# Patient Record
Sex: Female | Born: 1955 | Race: Black or African American | Hispanic: No | Smoking: Current every day smoker
Health system: Southern US, Community
[De-identification: ages and names within clinical notes are randomized; demographics above are authoritative.]

## PROBLEM LIST (undated history)

## (undated) DIAGNOSIS — I10 Essential (primary) hypertension: Secondary | ICD-10-CM

## (undated) DIAGNOSIS — F319 Bipolar disorder, unspecified: Secondary | ICD-10-CM

## (undated) DIAGNOSIS — E119 Type 2 diabetes mellitus without complications: Secondary | ICD-10-CM

## (undated) DIAGNOSIS — M1990 Unspecified osteoarthritis, unspecified site: Secondary | ICD-10-CM

## (undated) DIAGNOSIS — J449 Chronic obstructive pulmonary disease, unspecified: Secondary | ICD-10-CM

## (undated) DIAGNOSIS — G4730 Sleep apnea, unspecified: Secondary | ICD-10-CM

## (undated) DIAGNOSIS — Z8659 Personal history of other mental and behavioral disorders: Secondary | ICD-10-CM

## (undated) HISTORY — PX: JOINT REPLACEMENT: SHX530

---

## 2007-12-10 DIAGNOSIS — F319 Bipolar disorder, unspecified: Secondary | ICD-10-CM | POA: Insufficient documentation

## 2009-11-24 DIAGNOSIS — E1165 Type 2 diabetes mellitus with hyperglycemia: Secondary | ICD-10-CM | POA: Insufficient documentation

## 2012-12-06 ENCOUNTER — Emergency Department (HOSPITAL_COMMUNITY)
Admission: EM | Admit: 2012-12-06 | Discharge: 2012-12-06 | Disposition: A | Discharge status: Home / Self Care | Payer: Medicare Other | Attending: Emergency Medicine | Admitting: Emergency Medicine

## 2012-12-06 ENCOUNTER — Emergency Department (HOSPITAL_COMMUNITY): Payer: Medicare Other

## 2012-12-06 ENCOUNTER — Encounter (HOSPITAL_COMMUNITY): Payer: Self-pay

## 2012-12-06 DIAGNOSIS — J449 Chronic obstructive pulmonary disease, unspecified: Secondary | ICD-10-CM | POA: Insufficient documentation

## 2012-12-06 DIAGNOSIS — Z8709 Personal history of other diseases of the respiratory system: Secondary | ICD-10-CM | POA: Insufficient documentation

## 2012-12-06 DIAGNOSIS — E119 Type 2 diabetes mellitus without complications: Secondary | ICD-10-CM | POA: Insufficient documentation

## 2012-12-06 DIAGNOSIS — J40 Bronchitis, not specified as acute or chronic: Secondary | ICD-10-CM | POA: Insufficient documentation

## 2012-12-06 DIAGNOSIS — Z79899 Other long term (current) drug therapy: Secondary | ICD-10-CM | POA: Insufficient documentation

## 2012-12-06 DIAGNOSIS — Z720 Tobacco use: Secondary | ICD-10-CM

## 2012-12-06 DIAGNOSIS — G4730 Sleep apnea, unspecified: Secondary | ICD-10-CM | POA: Insufficient documentation

## 2012-12-06 DIAGNOSIS — I10 Essential (primary) hypertension: Secondary | ICD-10-CM | POA: Insufficient documentation

## 2012-12-06 DIAGNOSIS — Z8701 Personal history of pneumonia (recurrent): Secondary | ICD-10-CM | POA: Insufficient documentation

## 2012-12-06 DIAGNOSIS — J4489 Other specified chronic obstructive pulmonary disease: Secondary | ICD-10-CM | POA: Insufficient documentation

## 2012-12-06 DIAGNOSIS — F17200 Nicotine dependence, unspecified, uncomplicated: Secondary | ICD-10-CM | POA: Insufficient documentation

## 2012-12-06 HISTORY — DX: Bipolar disorder, unspecified: F31.9

## 2012-12-06 HISTORY — DX: Essential (primary) hypertension: I10

## 2012-12-06 HISTORY — DX: Type 2 diabetes mellitus without complications: E11.9

## 2012-12-06 HISTORY — DX: Sleep apnea, unspecified: G47.30

## 2012-12-06 HISTORY — DX: Chronic obstructive pulmonary disease, unspecified: J44.9

## 2012-12-06 HISTORY — DX: Unspecified osteoarthritis, unspecified site: M19.90

## 2012-12-06 LAB — BASIC METABOLIC PANEL
Calcium: 9.5 mg/dL (ref 8.4–10.5)
Creatinine, Ser: 0.72 mg/dL (ref 0.50–1.10)
GFR calc non Af Amer: >90 mL/min (ref >90)
Sodium: 138 mEq/L (ref 135–145)

## 2012-12-06 LAB — URINALYSIS, ROUTINE W REFLEX MICROSCOPIC
Ketones, ur: NEGATIVE mg/dL
Leukocytes, UA: NEGATIVE
Protein, ur: NEGATIVE mg/dL
Urobilinogen, UA: 0.2 mg/dL (ref 0.0–1.0)

## 2012-12-06 LAB — CBC WITH DIFFERENTIAL/PLATELET
Basophils Absolute: 0 10*3/uL (ref 0.0–0.1)
Eosinophils Absolute: 0.1 10*3/uL (ref 0.0–0.7)
Eosinophils Relative: 2 % (ref 0–5)
MCH: 28.9 pg (ref 26.0–34.0)
MCHC: 31.8 g/dL (ref 30.0–36.0)
MCV: 90.8 fL (ref 78.0–100.0)
Monocytes Absolute: 0.6 10*3/uL (ref 0.1–1.0)
Platelets: 193 10*3/uL (ref 150–400)
RDW: 13.3 % (ref 11.5–15.5)
WBC: 8.2 10*3/uL (ref 4.0–10.5)

## 2012-12-06 LAB — URINE MICROSCOPIC-ADD ON

## 2012-12-06 MED ORDER — PREDNISONE 20 MG PO TABS: 20.0000 mg | ORAL_TABLET | Freq: Two times a day (BID) | ORAL | Status: DC

## 2012-12-06 MED ORDER — ALBUTEROL (5 MG/ML) CONTINUOUS INHALATION SOLN
10.0000 mg/h | INHALATION_SOLUTION | Freq: Once | RESPIRATORY_TRACT | Status: AC
  Administered 2012-12-06: 10 mg/h via RESPIRATORY_TRACT
  Filled 2012-12-06: qty 20

## 2012-12-06 MED ORDER — ALBUTEROL SULFATE HFA 108 (90 BASE) MCG/ACT IN AERS
2.0000 | INHALATION_SPRAY | RESPIRATORY_TRACT | Status: DC | PRN
  Administered 2012-12-06: 2 via RESPIRATORY_TRACT
  Filled 2012-12-06: qty 6.7

## 2012-12-06 MED ORDER — AEROCHAMBER Z-STAT PLUS/MEDIUM MISC: 1.0000 | Freq: Once | Status: DC

## 2012-12-06 MED ORDER — PREDNISONE 50 MG PO TABS
60.0000 mg | ORAL_TABLET | Freq: Once | ORAL | Status: AC
  Administered 2012-12-06: 60 mg via ORAL
  Filled 2012-12-06: qty 1

## 2012-12-06 NOTE — ED Provider Notes (Addendum)
History     CSN: [id number]  Arrival date & time 12/06/12  7829   First MD Initiated Contact with Patient 12/06/12 0234      Chief Complaint  Patient presents with  . Shortness of Breath    (Consider location/radiation/quality/duration/timing/severity/associated sxs/prior treatment) HPI Comments: Donna Chase is a 57 y.o. female  Patient is a 57 y.o. female presenting with shortness of breath. The history is provided by the patient.  Shortness of Breath Severity:  Mild Onset quality:  Gradual Duration:  1 day Timing:  Constant Progression:  Unchanged Chronicity:  New Context: not known allergens and not occupational exposure   Context comment:  Usual activity Relieved by:  Nothing Worsened by:  Activity (and lying down) Ineffective treatments:  None tried Associated symptoms: cough (nonproductive)) and headaches   Associated symptoms: no diaphoresis, no fever and no sore throat     Past Medical History  Diagnosis Date  . Hypertension   . Diabetes mellitus without complication   . COPD (chronic obstructive pulmonary disease)   . Bipolar 1 disorder   . Arthritis   . Sleep apnea     History reviewed. No pertinent past surgical history.  No family history on file.  History  Substance Use Topics  . Smoking status: Current Every Day Smoker  . Smokeless tobacco: Not on file  . Alcohol Use: No    OB History   Grav Para Term Preterm Abortions TAB SAB Ect Mult Living                  Review of Systems  Constitutional: Negative for fever and diaphoresis.  HENT: Negative for sore throat.   Respiratory: Positive for cough (nonproductive)) and shortness of breath.   Neurological: Positive for headaches.  All other systems reviewed and are negative.    Allergies  Review of patient's allergies indicates no known allergies.  Home Medications   Current Outpatient Rx  Name  Route  Sig  Dispense  Refill  . citalopram (CELEXA) 20 MG tablet   Oral   Take  20 mg by mouth daily.         . divalproex (DEPAKOTE) 500 MG DR tablet   Oral   Take 500 mg by mouth 3 (three) times daily.         Marland Kitchen glyBURIDE (DIABETA) 2.5 MG tablet   Oral   Take 2.5 mg by mouth daily with breakfast.         . HYDROcodone-acetaminophen (NORCO/VICODIN) 5-325 MG per tablet   Oral   Take 1 tablet by mouth every 8 (eight) hours as needed for pain.         Marland Kitchen ibuprofen (ADVIL,MOTRIN) 800 MG tablet   Oral   Take 800 mg by mouth every 8 (eight) hours as needed for pain.         Marland Kitchen lisinopril (PRINIVIL,ZESTRIL) 10 MG tablet   Oral   Take 10 mg by mouth daily.         . meloxicam (MOBIC) 7.5 MG tablet   Oral   Take 7.5 mg by mouth daily.         . predniSONE (DELTASONE) 20 MG tablet   Oral   Take 1 tablet (20 mg total) by mouth 2 (two) times daily.   10 tablet   0     BP 110/59  Pulse 89  Temp(Src) 98.4 F (36.9 C) (Oral)  Resp 21  Ht 5\' 6"  (1.676 m)  Wt 255 lb (115.667 kg)  BMI 41.18 kg/m2  SpO2 92%  Physical Exam  Nursing note and vitals reviewed. Constitutional: She is oriented to person, place, and time. She appears well-developed.  Morbidly obese  HENT:  Head: Normocephalic and atraumatic.  Eyes: Conjunctivae and EOM are normal. Pupils are equal, round, and reactive to light.  Neck: Normal range of motion and phonation normal. Neck supple.  Cardiovascular: Normal rate, regular rhythm and intact distal pulses.   Pulmonary/Chest: Effort normal. No respiratory distress. She has wheezes. She has no rales. She exhibits no tenderness.  Decreased air movement, and scattered rhonchi  Abdominal: Soft. She exhibits no distension. There is no tenderness. There is no guarding.  Musculoskeletal: Normal range of motion.  Neurological: She is alert and oriented to person, place, and time. She has normal strength. She exhibits normal muscle tone.  Skin: Skin is warm and dry.  Psychiatric: She has a normal mood and affect. Her behavior is normal.  Judgment and thought content normal.    ED Course  Procedures (including critical care time)  Medications  albuterol (PROVENTIL HFA;VENTOLIN HFA) 108 (90 BASE) MCG/ACT inhaler 2 puff (2 puffs Inhalation Given 12/06/12 0603)  aerochamber Z-Stat Plus/medium 1 each (not administered)  albuterol (PROVENTIL,VENTOLIN) solution continuous neb (10 mg/hr Nebulization Given 12/06/12 0312)  predniSONE (DELTASONE) tablet 60 mg (60 mg Oral Given 12/06/12 0303)   Patient Vitals for the past 24 hrs:  BP Temp Temp src Pulse Resp SpO2 Height Weight  12/06/12 0606 - - - - - 92 % - -  12/06/12 0500 110/59 mmHg - - 89 - 94 % - -  12/06/12 0445 - - - 97 - 95 % - -  12/06/12 0430 - - - 94 - 100 % - -  12/06/12 0415 - - - 91 - 100 % - -  12/06/12 0330 - - - 88 21 100 % - -  12/06/12 0318 - - - - - 98 % - -  12/06/12 0243 129/81 mmHg 98.4 F (36.9 C) Oral 88 - 98 % 5\' 6"  (1.676 m) 255 lb (115.667 kg)     05:45 reevaluation- the patient was sleeping and her O2 sat was 88%. I aroused her, she awoke easily and took a few deep breath, that her O2 saturation improved to 95%. She states that she has sleep apnea, has been fitted with a mask, but does not wear it because she cannot tolerate it. She continues to smoke one half pack of cigarettes each day.     Labs Reviewed  BASIC METABOLIC PANEL - Abnormal; Notable for the following:    Glucose, Bld 169 (*)    All other components within normal limits  URINALYSIS, ROUTINE W REFLEX MICROSCOPIC - Abnormal; Notable for the following:    Hgb urine dipstick TRACE (*)    All other components within normal limits  URINE MICROSCOPIC-ADD ON - Abnormal; Notable for the following:    Squamous Epithelial / LPF MANY (*)    Bacteria, UA FEW (*)    All other components within normal limits  CBC WITH DIFFERENTIAL   Dg Chest 2 View  12/06/2012  *RADIOLOGY REPORT*  Clinical Data: Shortness of breath.  CHEST - 2 VIEW  Comparison: None.  Findings: Shallow inspiration.  Mild  cardiac enlargement with normal pulmonary vascularity.  No focal consolidation or airspace disease in the lungs.  No blunting of costophrenic angles.  No pneumothorax.  Degenerative changes in the thoracic spine.  IMPRESSION: Shallow inspiration.  No evidence of active pulmonary disease.  Original Report Authenticated By: Burman Nieves, M.D.    Nursing Notes Reviewed/ Care Coordinated, and agree without changes. Applicable Imaging Reviewed. Radiologic imaging report reviewed and images by radiography  - viewed, by me. Interpretation of Laboratory Data incorporated into ED treatment  1. Bronchitis   2. Sleep apnea   3. Tobacco abuse       MDM  Bronchitis, etiology, not clear. The patient has incidental sleep apnea that is not currently being treated, at home. Pneumonia, PE, ACS. Doubt metabolic instability, serious bacterial infection or impending vascular collapse; the patient is stable for discharge.    Plan: Home Medications- albuterol inhaler with AeroChamber for home use, but does; Home Treatments- rest, stop smoking; Recommended follow up- follow up PCP, as soon as possible, for reevaluation, and to be reassessed for sleep apnea          Flint Melter, MD 12/06/12 1610  Flint Melter, MD 12/06/12 [phone]

## 2012-12-06 NOTE — Progress Notes (Signed)
Pt finished Cat, placed on 2lpm/[place]

## 2012-12-06 NOTE — Progress Notes (Addendum)
Treatment stopped for pt to go to x-ray and bathroom restarted as of note

## 2013-10-12 ENCOUNTER — Other Ambulatory Visit: Payer: Self-pay | Admitting: Neurosurgery

## 2013-11-02 ENCOUNTER — Encounter (HOSPITAL_COMMUNITY): Payer: Self-pay | Admitting: Pharmacy Technician

## 2013-11-08 ENCOUNTER — Inpatient Hospital Stay (HOSPITAL_COMMUNITY): Admission: RE | Admit: 2013-11-08 | Payer: Medicare Other | Source: Ambulatory Visit | Admitting: Neurosurgery

## 2013-11-08 ENCOUNTER — Encounter (HOSPITAL_COMMUNITY): Admission: RE | Payer: Self-pay | Source: Ambulatory Visit

## 2013-11-08 SURGERY — POSTERIOR LUMBAR FUSION 1 LEVEL
Anesthesia: General

## 2013-12-09 ENCOUNTER — Other Ambulatory Visit: Payer: Self-pay | Admitting: Neurosurgery

## 2014-01-04 ENCOUNTER — Encounter (HOSPITAL_COMMUNITY): Payer: Self-pay | Admitting: Pharmacy Technician

## 2014-01-10 ENCOUNTER — Encounter (HOSPITAL_COMMUNITY)
Admission: RE | Admit: 2014-01-10 | Discharge: 2014-01-10 | Disposition: A | Discharge status: Home / Self Care | Payer: Medicare Other | Source: Ambulatory Visit | Attending: Anesthesiology | Admitting: Anesthesiology

## 2014-01-10 ENCOUNTER — Encounter (HOSPITAL_COMMUNITY)
Admission: RE | Admit: 2014-01-10 | Discharge: 2014-01-10 | Disposition: A | Discharge status: Home / Self Care | Payer: Medicare Other | Source: Ambulatory Visit | Attending: Neurosurgery | Admitting: Neurosurgery

## 2014-01-10 ENCOUNTER — Encounter (HOSPITAL_COMMUNITY): Payer: Self-pay

## 2014-01-10 DIAGNOSIS — I10 Essential (primary) hypertension: Secondary | ICD-10-CM | POA: Insufficient documentation

## 2014-01-10 DIAGNOSIS — Z01812 Encounter for preprocedural laboratory examination: Secondary | ICD-10-CM | POA: Insufficient documentation

## 2014-01-10 DIAGNOSIS — Z01810 Encounter for preprocedural cardiovascular examination: Secondary | ICD-10-CM | POA: Insufficient documentation

## 2014-01-10 DIAGNOSIS — Z01818 Encounter for other preprocedural examination: Secondary | ICD-10-CM | POA: Insufficient documentation

## 2014-01-10 HISTORY — DX: Personal history of other mental and behavioral disorders: Z86.59

## 2014-01-10 LAB — CBC
HEMATOCRIT: 38.4 % (ref 36.0–46.0)
Hemoglobin: 11.8 g/dL — ABNORMAL LOW (ref 12.0–15.0)
MCH: 28.7 pg (ref 26.0–34.0)
MCHC: 30.7 g/dL (ref 30.0–36.0)
MCV: 93.4 fL (ref 78.0–100.0)
Platelets: 205 10*3/uL (ref 150–400)
RBC: 4.11 MIL/uL (ref 3.87–5.11)
RDW: 13 % (ref 11.5–15.5)
WBC: 6.3 10*3/uL (ref 4.0–10.5)

## 2014-01-10 LAB — BASIC METABOLIC PANEL
BUN: 13 mg/dL (ref 6–23)
CHLORIDE: 103 meq/L (ref 96–112)
CO2: 28 mEq/L (ref 19–32)
CREATININE: 0.65 mg/dL (ref 0.50–1.10)
Calcium: 8.8 mg/dL (ref 8.4–10.5)
GFR calc Af Amer: >90 mL/min (ref >90)
GFR calc non Af Amer: >90 mL/min (ref >90)
Glucose, Bld: 166 mg/dL — ABNORMAL HIGH (ref 70–99)
Potassium: 3.8 mEq/L (ref 3.7–5.3)
SODIUM: 142 meq/L (ref 137–147)

## 2014-01-10 LAB — SURGICAL PCR SCREEN
MRSA, PCR: NEGATIVE
Staphylococcus aureus: NEGATIVE

## 2014-01-10 LAB — TYPE AND SCREEN
ABO/RH(D): O POS
Antibody Screen: NEGATIVE

## 2014-01-10 LAB — ABO/RH: ABO/RH(D): O POS

## 2014-01-10 NOTE — Pre-Procedure Instructions (Signed)
Hart Robinsonsriscilla Bucklin  01/10/2014   Your procedure is scheduled on:  Monday, May 18th  [facility]mitting at 0530 AM.  Call this number if you have problems the morning of surgery: [phone]   Remember:   Do not eat food or drink liquids after midnight.   Take these medicines the morning of surgery with A SIP OF WATER: celexa, depakote, prednisone, vicodin if needed  Do NOT take any diabetes medication on morning of surgery   Do not wear jewelry, make-up or nail polish.  Do not wear lotions, powders, or perfumes. You may wear deodorant.  Do not shave 48 hours prior to surgery. Men may shave face and neck.  Do not bring valuables to the hospital.  [facility] is not responsible  for any belongings or valuables.               Contacts, dentures or bridgework may not be worn into surgery.  Leave suitcase in the car. After surgery it may be brought to your room.  For patients admitted to the hospital, discharge time is determined by your  treatment team.           Please read over the following fact sheets that you were given: Pain Booklet, Coughing and Deep Breathing, Blood Transfusion Information, MRSA Information and Surgical Site Infection Prevention [place] - Preparing for Surgery  Before surgery, you can play an important role.  Because skin is not sterile, your skin needs to be as free of germs as possible.  You can reduce the number of germs on you skin by washing with CHG (chlorahexidine gluconate) soap before surgery.  CHG is an antiseptic cleaner which kills germs and bonds with the skin to continue killing germs even after washing.  Please DO NOT use if you have an allergy to CHG or antibacterial soaps.  If your skin becomes reddened/irritated stop using the CHG and inform your nurse when you arrive at Short Stay.  Do not shave (including legs and underarms) for at least 48 hours prior to the first CHG shower.  You may shave your face.  Please  follow these instructions carefully:   1.  Shower with CHG Soap the night before surgery and the morning of Surgery.  2.  If you choose to wash your hair, wash your hair first as usual with your normal shampoo.  3.  After you shampoo, rinse your hair and body thoroughly to remove the shampoo.  4.  Use CHG as you would any other liquid soap.  You can apply CHG directly to the skin and wash gently with scrungie or a clean washcloth.  5.  Apply the CHG Soap to your body ONLY FROM THE NECK DOWN.  Do not use on open wounds or open sores.  Avoid contact with your eyes, ears, mouth and genitals (private parts).  Wash genitals (private parts) with your normal soap.  6.  Wash thoroughly, paying special attention to the area where your surgery will be performed.  7.  Thoroughly rinse your body with warm water from the neck down.  8.  DO NOT shower/wash with your normal soap after using and rinsing off the CHG Soap.  9.  Pat yourself dry with a clean towel.            10.  Wear clean pajamas.            11.  Place clean sheets on your bed the night of your  first shower and do not sleep with pets.  Day of Surgery  Do not apply any lotions/deoderants the morning of surgery.  Please wear clean clothes to the hospital/surgery center.

## 2014-01-10 NOTE — Progress Notes (Signed)
Primary physician - Dr. Janalyn RouseKaren Chase, [place] [facility] No cardiac workup in past

## 2014-01-11 NOTE — Progress Notes (Signed)
Anesthesia chart review: Patient is a 58 year old female scheduled for L4-5 laminectomy with PLIF on 01/17/14 by Dr. Lovell SheehanJenkins.  History includes HTN, smoking, COPD, Bipolar 1 disorder, OSA, arthritis, DM2, panic attacks, left THA. [place]. [place] No CV symptoms documented at her PAT visit.  EKG on 01/10/14 showed NSR.  Preoperative CXR and labs noted.    Further evaluation on the day of surgery by her assigned anesthesiologist, but if no acute changes then I would anticipate that she could proceed as planned.  Velna Ochsllison Maevyn Riordan, PA-C [facility]/Anesthesiology Phone [phone] 01/11/2014 10:16 AM

## 2014-01-16 MED ORDER — CEFAZOLIN SODIUM-DEXTROSE 2-3 GM-% IV SOLR
2.0000 g | INTRAVENOUS | Status: DC
  Filled 2014-01-16: qty 50

## 2014-01-17 ENCOUNTER — Encounter (HOSPITAL_COMMUNITY)
Admission: RE | Disposition: A | Discharge status: Skilled Nursing Facility | Payer: Medicare Other | Source: Ambulatory Visit | Attending: Neurosurgery

## 2014-01-17 ENCOUNTER — Encounter (HOSPITAL_COMMUNITY): Payer: Medicare Other | Admitting: Vascular Surgery

## 2014-01-17 ENCOUNTER — Inpatient Hospital Stay (HOSPITAL_COMMUNITY): Payer: Medicare Other

## 2014-01-17 ENCOUNTER — Inpatient Hospital Stay (HOSPITAL_COMMUNITY): Payer: Medicare Other | Admitting: Certified Registered Nurse Anesthetist

## 2014-01-17 ENCOUNTER — Inpatient Hospital Stay (HOSPITAL_COMMUNITY)
Admission: RE | Admit: 2014-01-17 | Discharge: 2014-01-21 | DRG: 460 | Disposition: A | Discharge status: Skilled Nursing Facility | Payer: Medicare Other | Source: Ambulatory Visit | Attending: Neurosurgery | Admitting: Neurosurgery

## 2014-01-17 ENCOUNTER — Encounter (HOSPITAL_COMMUNITY): Payer: Self-pay | Admitting: *Deleted

## 2014-01-17 DIAGNOSIS — G4730 Sleep apnea, unspecified: Secondary | ICD-10-CM | POA: Diagnosis present

## 2014-01-17 DIAGNOSIS — F17200 Nicotine dependence, unspecified, uncomplicated: Secondary | ICD-10-CM | POA: Diagnosis present

## 2014-01-17 DIAGNOSIS — M47817 Spondylosis without myelopathy or radiculopathy, lumbosacral region: Secondary | ICD-10-CM | POA: Diagnosis present

## 2014-01-17 DIAGNOSIS — F319 Bipolar disorder, unspecified: Secondary | ICD-10-CM | POA: Diagnosis present

## 2014-01-17 DIAGNOSIS — E119 Type 2 diabetes mellitus without complications: Secondary | ICD-10-CM | POA: Diagnosis present

## 2014-01-17 DIAGNOSIS — J4489 Other specified chronic obstructive pulmonary disease: Secondary | ICD-10-CM | POA: Diagnosis present

## 2014-01-17 DIAGNOSIS — I10 Essential (primary) hypertension: Secondary | ICD-10-CM | POA: Diagnosis present

## 2014-01-17 DIAGNOSIS — J449 Chronic obstructive pulmonary disease, unspecified: Secondary | ICD-10-CM | POA: Diagnosis present

## 2014-01-17 DIAGNOSIS — M810 Age-related osteoporosis without current pathological fracture: Secondary | ICD-10-CM | POA: Diagnosis present

## 2014-01-17 DIAGNOSIS — M51379 Other intervertebral disc degeneration, lumbosacral region without mention of lumbar back pain or lower extremity pain: Principal | ICD-10-CM | POA: Diagnosis present

## 2014-01-17 DIAGNOSIS — IMO0002 Reserved for concepts with insufficient information to code with codable children: Secondary | ICD-10-CM

## 2014-01-17 DIAGNOSIS — M5137 Other intervertebral disc degeneration, lumbosacral region: Principal | ICD-10-CM | POA: Diagnosis present

## 2014-01-17 DIAGNOSIS — Z96649 Presence of unspecified artificial hip joint: Secondary | ICD-10-CM

## 2014-01-17 DIAGNOSIS — F410 Panic disorder [episodic paroxysmal anxiety] without agoraphobia: Secondary | ICD-10-CM | POA: Diagnosis present

## 2014-01-17 DIAGNOSIS — Q762 Congenital spondylolisthesis: Secondary | ICD-10-CM

## 2014-01-17 DIAGNOSIS — M129 Arthropathy, unspecified: Secondary | ICD-10-CM | POA: Diagnosis present

## 2014-01-17 DIAGNOSIS — M4316 Spondylolisthesis, lumbar region: Secondary | ICD-10-CM

## 2014-01-17 LAB — GLUCOSE, CAPILLARY
GLUCOSE-CAPILLARY: 183 mg/dL — AB (ref 70–99)
Glucose-Capillary: 147 mg/dL — ABNORMAL HIGH (ref 70–99)
Glucose-Capillary: 205 mg/dL — ABNORMAL HIGH (ref 70–99)
Glucose-Capillary: 157 mg/dL — ABNORMAL HIGH (ref 70–99)
Glucose-Capillary: 180 mg/dL — ABNORMAL HIGH (ref 70–99)

## 2014-01-17 SURGERY — POSTERIOR LUMBAR FUSION 1 LEVEL
Anesthesia: General | Site: Back

## 2014-01-17 MED ORDER — ONDANSETRON HCL 4 MG/2ML IJ SOLN
INTRAMUSCULAR | Status: AC
  Filled 2014-01-17: qty 2

## 2014-01-17 MED ORDER — NEOSTIGMINE METHYLSULFATE 10 MG/10ML IV SOLN
INTRAVENOUS | Status: AC
  Filled 2014-01-17: qty 1

## 2014-01-17 MED ORDER — ROCURONIUM BROMIDE 50 MG/5ML IV SOLN
INTRAVENOUS | Status: AC
  Filled 2014-01-17: qty 1

## 2014-01-17 MED ORDER — PHENOL 1.4 % MT LIQD: 1.0000 | OROMUCOSAL | Status: DC | PRN

## 2014-01-17 MED ORDER — ARTIFICIAL TEARS OP OINT
TOPICAL_OINTMENT | OPHTHALMIC | Status: AC
  Filled 2014-01-17: qty 3.5

## 2014-01-17 MED ORDER — BACITRACIN ZINC 500 UNIT/GM EX OINT
TOPICAL_OINTMENT | CUTANEOUS | Status: DC | PRN
  Administered 2014-01-17: 1 via TOPICAL

## 2014-01-17 MED ORDER — DIAZEPAM 5 MG PO TABS
5.0000 mg | ORAL_TABLET | Freq: Four times a day (QID) | ORAL | Status: DC | PRN
  Administered 2014-01-19 – 2014-01-20 (×5): 5 mg via ORAL
  Filled 2014-01-17 (×5): qty 1

## 2014-01-17 MED ORDER — DIVALPROEX SODIUM 500 MG PO DR TAB
500.0000 mg | DELAYED_RELEASE_TABLET | Freq: Three times a day (TID) | ORAL | Status: DC
  Administered 2014-01-17 – 2014-01-21 (×12): 500 mg via ORAL
  Filled 2014-01-17 (×15): qty 1

## 2014-01-17 MED ORDER — MIDAZOLAM HCL 2 MG/2ML IJ SOLN
INTRAMUSCULAR | Status: AC
  Filled 2014-01-17: qty 2

## 2014-01-17 MED ORDER — HYDROCODONE-ACETAMINOPHEN 10-325 MG PO TABS: 1.0000 | ORAL_TABLET | ORAL | Status: DC | PRN

## 2014-01-17 MED ORDER — LIDOCAINE HCL (CARDIAC) 20 MG/ML IV SOLN
INTRAVENOUS | Status: AC
  Filled 2014-01-17: qty 5

## 2014-01-17 MED ORDER — GLYBURIDE 2.5 MG PO TABS
2.5000 mg | ORAL_TABLET | Freq: Every day | ORAL | Status: DC
  Administered 2014-01-18 – 2014-01-21 (×4): 2.5 mg via ORAL
  Filled 2014-01-17 (×5): qty 1

## 2014-01-17 MED ORDER — HYDROCORTISONE NA SUCCINATE PF 100 MG IJ SOLR
INTRAMUSCULAR | Status: DC | PRN
  Administered 2014-01-17: 100 mg via INTRAVENOUS

## 2014-01-17 MED ORDER — MENTHOL 3 MG MT LOZG: 1.0000 | LOZENGE | OROMUCOSAL | Status: DC | PRN

## 2014-01-17 MED ORDER — 0.9 % SODIUM CHLORIDE (POUR BTL) OPTIME
TOPICAL | Status: DC | PRN
  Administered 2014-01-17: 1000 mL

## 2014-01-17 MED ORDER — ALBUTEROL SULFATE HFA 108 (90 BASE) MCG/ACT IN AERS
INHALATION_SPRAY | RESPIRATORY_TRACT | Status: DC | PRN
  Administered 2014-01-17: 2 via RESPIRATORY_TRACT

## 2014-01-17 MED ORDER — PREDNISONE 20 MG PO TABS
20.0000 mg | ORAL_TABLET | Freq: Two times a day (BID) | ORAL | Status: DC
  Filled 2014-01-17 (×2): qty 1

## 2014-01-17 MED ORDER — ONDANSETRON HCL 4 MG/2ML IJ SOLN
INTRAMUSCULAR | Status: DC | PRN
  Administered 2014-01-17: 4 mg via INTRAVENOUS

## 2014-01-17 MED ORDER — OXYCODONE HCL 5 MG PO TABS
ORAL_TABLET | ORAL | Status: AC
  Filled 2014-01-17: qty 2

## 2014-01-17 MED ORDER — SUCCINYLCHOLINE CHLORIDE 20 MG/ML IJ SOLN
INTRAMUSCULAR | Status: AC
  Filled 2014-01-17: qty 1

## 2014-01-17 MED ORDER — ROCURONIUM BROMIDE 100 MG/10ML IV SOLN
INTRAVENOUS | Status: DC | PRN
  Administered 2014-01-17: 50 mg via INTRAVENOUS

## 2014-01-17 MED ORDER — PROPOFOL 10 MG/ML IV BOLUS
INTRAVENOUS | Status: AC
  Filled 2014-01-17: qty 20

## 2014-01-17 MED ORDER — OXYCODONE HCL 5 MG PO TABS
10.0000 mg | ORAL_TABLET | ORAL | Status: DC | PRN
  Administered 2014-01-17 – 2014-01-21 (×7): 10 mg via ORAL
  Filled 2014-01-17 (×7): qty 2

## 2014-01-17 MED ORDER — METHYLPREDNISOLONE SODIUM SUCC 125 MG IJ SOLR
60.0000 mg | Freq: Four times a day (QID) | INTRAMUSCULAR | Status: AC
  Administered 2014-01-17 – 2014-01-18 (×6): 60 mg via INTRAVENOUS
  Filled 2014-01-17: qty 2
  Filled 2014-01-17: qty 0.96
  Filled 2014-01-17: qty 2
  Filled 2014-01-17: qty 0.96
  Filled 2014-01-17: qty 2
  Filled 2014-01-17: qty 0.96

## 2014-01-17 MED ORDER — ONDANSETRON HCL 4 MG/2ML IJ SOLN: 4.0000 mg | INTRAMUSCULAR | Status: DC | PRN

## 2014-01-17 MED ORDER — HYDROMORPHONE HCL PF 1 MG/ML IJ SOLN
INTRAMUSCULAR | Status: AC
  Filled 2014-01-17: qty 1

## 2014-01-17 MED ORDER — ACETAMINOPHEN 650 MG RE SUPP: 650.0000 mg | RECTAL | Status: DC | PRN

## 2014-01-17 MED ORDER — GLYCOPYRROLATE 0.2 MG/ML IJ SOLN
INTRAMUSCULAR | Status: DC | PRN
  Administered 2014-01-17: .6 mg via INTRAVENOUS

## 2014-01-17 MED ORDER — BUPIVACAINE-EPINEPHRINE (PF) 0.5% -1:200000 IJ SOLN
INTRAMUSCULAR | Status: DC | PRN
  Administered 2014-01-17: 10 mL

## 2014-01-17 MED ORDER — BUPIVACAINE LIPOSOME 1.3 % IJ SUSP
INTRAMUSCULAR | Status: DC | PRN
  Administered 2014-01-17: 20 mL

## 2014-01-17 MED ORDER — FENTANYL CITRATE 0.05 MG/ML IJ SOLN
INTRAMUSCULAR | Status: DC | PRN
  Administered 2014-01-17: 50 ug via INTRAVENOUS
  Administered 2014-01-17: 150 ug via INTRAVENOUS

## 2014-01-17 MED ORDER — DOCUSATE SODIUM 100 MG PO CAPS
100.0000 mg | ORAL_CAPSULE | Freq: Two times a day (BID) | ORAL | Status: DC
  Administered 2014-01-17 – 2014-01-21 (×9): 100 mg via ORAL
  Filled 2014-01-17 (×8): qty 1

## 2014-01-17 MED ORDER — EPHEDRINE SULFATE 50 MG/ML IJ SOLN
INTRAMUSCULAR | Status: AC
  Filled 2014-01-17: qty 1

## 2014-01-17 MED ORDER — EPHEDRINE SULFATE 50 MG/ML IJ SOLN
INTRAMUSCULAR | Status: DC | PRN
  Administered 2014-01-17 (×2): 5 mg via INTRAVENOUS

## 2014-01-17 MED ORDER — BUPIVACAINE LIPOSOME 1.3 % IJ SUSP
20.0000 mL | INTRAMUSCULAR | Status: AC
  Filled 2014-01-17: qty 20

## 2014-01-17 MED ORDER — MORPHINE SULFATE 2 MG/ML IJ SOLN: 1.0000 mg | INTRAMUSCULAR | Status: DC | PRN

## 2014-01-17 MED ORDER — PHENYLEPHRINE HCL 10 MG/ML IJ SOLN
10.0000 mg | INTRAVENOUS | Status: DC | PRN
  Administered 2014-01-17: 10 ug/min via INTRAVENOUS

## 2014-01-17 MED ORDER — PHENYLEPHRINE HCL 10 MG/ML IJ SOLN
INTRAMUSCULAR | Status: DC | PRN
  Administered 2014-01-17: 80 ug via INTRAVENOUS
  Administered 2014-01-17 (×2): 120 ug via INTRAVENOUS
  Administered 2014-01-17: 80 ug via INTRAVENOUS

## 2014-01-17 MED ORDER — SODIUM CHLORIDE 0.9 % IJ SOLN
INTRAMUSCULAR | Status: AC
  Filled 2014-01-17: qty 10

## 2014-01-17 MED ORDER — PROPOFOL 10 MG/ML IV BOLUS
INTRAVENOUS | Status: DC | PRN
  Administered 2014-01-17: 200 mg via INTRAVENOUS

## 2014-01-17 MED ORDER — LACTATED RINGERS IV SOLN
INTRAVENOUS | Status: DC
  Administered 2014-01-17: 15:00:00 via INTRAVENOUS
  Administered 2014-01-17: 1000 mL via INTRAVENOUS

## 2014-01-17 MED ORDER — ALUM & MAG HYDROXIDE-SIMETH 200-200-20 MG/5ML PO SUSP: 30.0000 mL | Freq: Four times a day (QID) | ORAL | Status: DC | PRN

## 2014-01-17 MED ORDER — HYDROCODONE-ACETAMINOPHEN 5-325 MG PO TABS
1.0000 | ORAL_TABLET | ORAL | Status: DC | PRN
  Administered 2014-01-17: 2 via ORAL
  Administered 2014-01-21: 1 via ORAL
  Filled 2014-01-17: qty 2
  Filled 2014-01-17: qty 1

## 2014-01-17 MED ORDER — ACETAMINOPHEN 325 MG PO TABS
650.0000 mg | ORAL_TABLET | ORAL | Status: DC | PRN
  Administered 2014-01-18: 650 mg via ORAL
  Filled 2014-01-17: qty 2

## 2014-01-17 MED ORDER — NEOSTIGMINE METHYLSULFATE 10 MG/10ML IV SOLN
INTRAVENOUS | Status: DC | PRN
  Administered 2014-01-17: 4 mg via INTRAVENOUS

## 2014-01-17 MED ORDER — GLYCOPYRROLATE 0.2 MG/ML IJ SOLN
INTRAMUSCULAR | Status: AC
  Filled 2014-01-17: qty 3

## 2014-01-17 MED ORDER — ARTIFICIAL TEARS OP OINT
TOPICAL_OINTMENT | OPHTHALMIC | Status: DC | PRN
  Administered 2014-01-17: 1 via OPHTHALMIC

## 2014-01-17 MED ORDER — FENTANYL CITRATE 0.05 MG/ML IJ SOLN
INTRAMUSCULAR | Status: AC
  Filled 2014-01-17: qty 5

## 2014-01-17 MED ORDER — SODIUM CHLORIDE 0.9 % IR SOLN
Status: DC | PRN
  Administered 2014-01-17: 09:00:00

## 2014-01-17 MED ORDER — HYDROMORPHONE HCL PF 1 MG/ML IJ SOLN
0.2500 mg | INTRAMUSCULAR | Status: DC | PRN
  Administered 2014-01-17: 0.25 mg via INTRAVENOUS
  Administered 2014-01-17: 0.5 mg via INTRAVENOUS
  Administered 2014-01-17: 0.25 mg via INTRAVENOUS

## 2014-01-17 MED ORDER — THROMBIN 20000 UNITS EX SOLR
CUTANEOUS | Status: DC | PRN
  Administered 2014-01-17: 10:00:00 via TOPICAL

## 2014-01-17 MED ORDER — INSULIN ASPART 100 UNIT/ML ~~LOC~~ SOLN
0.0000 [IU] | SUBCUTANEOUS | Status: DC
  Administered 2014-01-17 – 2014-01-18 (×5): 4 [IU] via SUBCUTANEOUS
  Administered 2014-01-18: 7 [IU] via SUBCUTANEOUS
  Administered 2014-01-18 – 2014-01-19 (×3): 4 [IU] via SUBCUTANEOUS
  Administered 2014-01-19 (×2): 3 [IU] via SUBCUTANEOUS
  Administered 2014-01-20: 7 [IU] via SUBCUTANEOUS
  Administered 2014-01-20: 4 [IU] via SUBCUTANEOUS
  Administered 2014-01-20: 3 [IU] via SUBCUTANEOUS
  Administered 2014-01-21: 7 [IU] via SUBCUTANEOUS
  Administered 2014-01-21: 3 [IU] via SUBCUTANEOUS

## 2014-01-17 MED ORDER — LISINOPRIL 40 MG PO TABS
40.0000 mg | ORAL_TABLET | Freq: Every day | ORAL | Status: DC
  Administered 2014-01-17 – 2014-01-21 (×4): 40 mg via ORAL
  Filled 2014-01-17 (×6): qty 1

## 2014-01-17 MED ORDER — PHENYLEPHRINE 40 MCG/ML (10ML) SYRINGE FOR IV PUSH (FOR BLOOD PRESSURE SUPPORT)
PREFILLED_SYRINGE | INTRAVENOUS | Status: AC
  Filled 2014-01-17: qty 10

## 2014-01-17 MED ORDER — LACTATED RINGERS IV SOLN
INTRAVENOUS | Status: DC | PRN
  Administered 2014-01-17 (×3): via INTRAVENOUS

## 2014-01-17 MED ORDER — OXYCODONE-ACETAMINOPHEN 5-325 MG PO TABS
1.0000 | ORAL_TABLET | ORAL | Status: DC | PRN
  Administered 2014-01-18 – 2014-01-21 (×10): 2 via ORAL
  Filled 2014-01-17 (×12): qty 2

## 2014-01-17 MED ORDER — CEFAZOLIN SODIUM-DEXTROSE 2-3 GM-% IV SOLR
2.0000 g | Freq: Three times a day (TID) | INTRAVENOUS | Status: AC
  Administered 2014-01-17 (×2): 2 g via INTRAVENOUS
  Filled 2014-01-17 (×3): qty 50

## 2014-01-17 MED ORDER — CITALOPRAM HYDROBROMIDE 40 MG PO TABS
40.0000 mg | ORAL_TABLET | Freq: Every day | ORAL | Status: DC
  Administered 2014-01-17 – 2014-01-21 (×5): 40 mg via ORAL
  Filled 2014-01-17 (×7): qty 1

## 2014-01-17 MED ORDER — MIDAZOLAM HCL 5 MG/5ML IJ SOLN
INTRAMUSCULAR | Status: DC | PRN
  Administered 2014-01-17: 2 mg via INTRAVENOUS

## 2014-01-17 MED ORDER — PREDNISONE 20 MG PO TABS
20.0000 mg | ORAL_TABLET | Freq: Two times a day (BID) | ORAL | Status: DC
  Administered 2014-01-19 – 2014-01-21 (×5): 20 mg via ORAL
  Filled 2014-01-17 (×7): qty 1

## 2014-01-17 MED ORDER — LIDOCAINE HCL (CARDIAC) 20 MG/ML IV SOLN
INTRAVENOUS | Status: DC | PRN
  Administered 2014-01-17: 60 mg via INTRAVENOUS
  Administered 2014-01-17: 20 mg via INTRAVENOUS

## 2014-01-17 SURGICAL SUPPLY — 74 items
BAG DECANTER FOR FLEXI CONT (MISCELLANEOUS) ×3 IMPLANT
BENZOIN TINCTURE PRP APPL 2/3 (GAUZE/BANDAGES/DRESSINGS) ×3 IMPLANT
BLADE 10 SAFETY STRL DISP (BLADE) IMPLANT
BLADE SURG ROTATE 9660 (MISCELLANEOUS) IMPLANT
BRUSH SCRUB EZ PLAIN DRY (MISCELLANEOUS) ×3 IMPLANT
BUR ACORN 6.0 (BURR) ×2 IMPLANT
BUR ACORN 6.0MM (BURR) ×1
BUR MATCHSTICK NEURO 3.0 LAGG (BURR) ×3 IMPLANT
CANISTER SUCT 3000ML (MISCELLANEOUS) ×3 IMPLANT
CAP REVERE LOCKING (Cap) ×12 IMPLANT
CLOSURE WOUND 1/2 X4 (GAUZE/BANDAGES/DRESSINGS) ×1
CONT SPEC 4OZ CLIKSEAL STRL BL (MISCELLANEOUS) ×3 IMPLANT
COVER BACK TABLE 24X17X13 BIG (DRAPES) IMPLANT
COVER TABLE BACK 60X90 (DRAPES) ×3 IMPLANT
DRAPE C-ARM 42X72 X-RAY (DRAPES) ×6 IMPLANT
DRAPE LAPAROTOMY 100X72X124 (DRAPES) ×3 IMPLANT
DRAPE POUCH INSTRU U-SHP 10X18 (DRAPES) ×3 IMPLANT
DRAPE PROXIMA HALF (DRAPES) ×3 IMPLANT
DRAPE SURG 17X23 STRL (DRAPES) ×12 IMPLANT
ELECT BLADE 4.0 EZ CLEAN MEGAD (MISCELLANEOUS) ×3
ELECT REM PT RETURN 9FT ADLT (ELECTROSURGICAL) ×3
ELECTRODE BLDE 4.0 EZ CLN MEGD (MISCELLANEOUS) ×1 IMPLANT
ELECTRODE REM PT RTRN 9FT ADLT (ELECTROSURGICAL) ×1 IMPLANT
EVACUATOR 1/8 PVC DRAIN (DRAIN) ×3 IMPLANT
GAUZE SPONGE 4X4 16PLY XRAY LF (GAUZE/BANDAGES/DRESSINGS) ×6 IMPLANT
GLOVE BIO SURGEON STRL SZ8.5 (GLOVE) ×6 IMPLANT
GLOVE BIOGEL PI IND STRL 7.0 (GLOVE) ×1 IMPLANT
GLOVE BIOGEL PI IND STRL 7.5 (GLOVE) ×1 IMPLANT
GLOVE BIOGEL PI INDICATOR 7.0 (GLOVE) ×2
GLOVE BIOGEL PI INDICATOR 7.5 (GLOVE) ×2
GLOVE ECLIPSE 7.0 STRL STRAW (GLOVE) ×3 IMPLANT
GLOVE ECLIPSE 7.5 STRL STRAW (GLOVE) ×9 IMPLANT
GLOVE EXAM NITRILE LRG STRL (GLOVE) IMPLANT
GLOVE EXAM NITRILE MD LF STRL (GLOVE) IMPLANT
GLOVE EXAM NITRILE XL STR (GLOVE) IMPLANT
GLOVE EXAM NITRILE XS STR PU (GLOVE) IMPLANT
GLOVE SS BIOGEL STRL SZ 8 (GLOVE) ×2 IMPLANT
GLOVE SS N UNI LF 7.0 STRL (GLOVE) ×9 IMPLANT
GLOVE SUPERSENSE BIOGEL SZ 8 (GLOVE) ×4
GOWN STRL REUS W/ TWL LRG LVL3 (GOWN DISPOSABLE) IMPLANT
GOWN STRL REUS W/ TWL XL LVL3 (GOWN DISPOSABLE) ×5 IMPLANT
GOWN STRL REUS W/TWL 2XL LVL3 (GOWN DISPOSABLE) IMPLANT
GOWN STRL REUS W/TWL LRG LVL3 (GOWN DISPOSABLE)
GOWN STRL REUS W/TWL XL LVL3 (GOWN DISPOSABLE) ×10
KIT BASIN OR (CUSTOM PROCEDURE TRAY) ×3 IMPLANT
KIT ROOM TURNOVER OR (KITS) ×3 IMPLANT
NEEDLE HYPO 21X1.5 SAFETY (NEEDLE) IMPLANT
NEEDLE HYPO 22GX1.5 SAFETY (NEEDLE) ×3 IMPLANT
NS IRRIG 1000ML POUR BTL (IV SOLUTION) ×3 IMPLANT
PACK FOAM VITOSS 10CC (Orthopedic Implant) ×3 IMPLANT
PACK LAMINECTOMY NEURO (CUSTOM PROCEDURE TRAY) ×3 IMPLANT
PAD ARMBOARD 7.5X6 YLW CONV (MISCELLANEOUS) ×9 IMPLANT
PATTIES SURGICAL .5 X1 (DISPOSABLE) IMPLANT
PATTIES SURGICAL 1X1 (DISPOSABLE) ×3 IMPLANT
PUTTY 10ML ACTIFUSE ABX (Putty) ×3 IMPLANT
ROD REVERE 6.35 45MM (Rod) ×6 IMPLANT
SCREW 7.5X50MM (Screw) ×12 IMPLANT
SPACER SUSTAIN O 10X26 12MM (Spacer) ×6 IMPLANT
SPONGE GAUZE 4X4 12PLY (GAUZE/BANDAGES/DRESSINGS) ×3 IMPLANT
SPONGE LAP 4X18 X RAY DECT (DISPOSABLE) IMPLANT
SPONGE NEURO XRAY DETECT 1X3 (DISPOSABLE) IMPLANT
SPONGE SURGIFOAM ABS GEL 100 (HEMOSTASIS) ×3 IMPLANT
STRIP CLOSURE SKIN 1/2X4 (GAUZE/BANDAGES/DRESSINGS) ×2 IMPLANT
SUT VIC AB 1 CT1 18XBRD ANBCTR (SUTURE) ×2 IMPLANT
SUT VIC AB 1 CT1 8-18 (SUTURE) ×4
SUT VIC AB 2-0 CP2 18 (SUTURE) ×6 IMPLANT
SYR 20CC LL (SYRINGE) IMPLANT
SYR 20ML ECCENTRIC (SYRINGE) ×3 IMPLANT
TAPE CLOTH SURG 4X10 WHT LF (GAUZE/BANDAGES/DRESSINGS) ×3 IMPLANT
TAPE STRIPS DRAPE STRL (GAUZE/BANDAGES/DRESSINGS) ×3 IMPLANT
TOWEL OR 17X24 6PK STRL BLUE (TOWEL DISPOSABLE) ×3 IMPLANT
TOWEL OR 17X26 10 PK STRL BLUE (TOWEL DISPOSABLE) ×3 IMPLANT
TRAY FOLEY CATH 14FRSI W/METER (CATHETERS) ×3 IMPLANT
WATER STERILE IRR 1000ML POUR (IV SOLUTION) ×3 IMPLANT

## 2014-01-17 NOTE — Anesthesia Procedure Notes (Signed)
Procedure Name: Intubation Date/Time: 01/17/2014 7:38 AM Performed by: Angelica PouSMITH, Danya Spearman PIZZICARA Pre-anesthesia Checklist: Patient identified, Patient being monitored, Emergency Drugs available, Timeout performed and Suction available Patient Re-evaluated:Patient Re-evaluated prior to inductionOxygen Delivery Method: Circle system utilized Preoxygenation: Pre-oxygenation with 100% oxygen Intubation Type: IV induction Ventilation: Oral airway inserted - appropriate to patient size and Mask ventilation without difficulty Laryngoscope Size: Mac and 4 Grade View: Grade I Tube type: Oral Tube size: 7.0 mm Number of attempts: 1 Airway Equipment and Method: Oral airway and Stylet Placement Confirmation: ETT inserted through vocal cords under direct vision,  breath sounds checked- equal and bilateral and positive ETCO2 Secured at: 22 cm Tube secured with: Tape Dental Injury: Teeth and Oropharynx as per pre-operative assessment

## 2014-01-17 NOTE — Progress Notes (Signed)
Utilization review completed.  

## 2014-01-17 NOTE — Anesthesia Preprocedure Evaluation (Signed)
Anesthesia Evaluation  Patient identified by MRN, date of birth, ID band Patient awake    Reviewed: Allergy & Precautions, H&P , NPO status , Patient's Chart, lab work & pertinent test results  Airway Mallampati: II      Dental   Pulmonary sleep apnea , COPDCurrent Smoker,  breath sounds clear to auscultation        Cardiovascular hypertension, Rhythm:Regular Rate:Normal     Neuro/Psych    GI/Hepatic negative GI ROS,   Endo/Other  diabetes  Renal/GU      Musculoskeletal   Abdominal   Peds  Hematology   Anesthesia Other Findings   Reproductive/Obstetrics                           Anesthesia Physical Anesthesia Plan  ASA: III  Anesthesia Plan: General   Post-op Pain Management:    Induction: Intravenous  Airway Management Planned: Oral ETT  Additional Equipment:   Intra-op Plan:   Post-operative Plan: Possible Post-op intubation/ventilation  Informed Consent: I have reviewed the patients History and Physical, chart, labs and discussed the procedure including the risks, benefits and alternatives for the proposed anesthesia with the patient or authorized representative who has indicated his/her understanding and acceptance.   Dental advisory given  Plan Discussed with: CRNA and Anesthesiologist  Anesthesia Plan Comments:         Anesthesia Quick Evaluation

## 2014-01-17 NOTE — Progress Notes (Signed)
Patient ID: Donna Chase, female   DOB: July 22, 1956, 58 y.o.   MRN: [id number] Subjective:  I saw the patient in the PACU. She was, but easily arousable. She was in no apparent distress.  Objective: Vital signs in last 24 hours: Temp:  [98.8 F (37.1 C)-99.7 F (37.6 C)] 98.8 F (37.1 C) (05/18 1324) Pulse Rate:  [67-93] 67 (05/18 1324) Resp:  [14-20] 18 (05/18 1324) BP: (109-161)/(63-83) 124/63 mmHg (05/18 1324) SpO2:  [93 %-100 %] 100 % (05/18 1324) Weight:  [113.853 kg (251 lb)] 113.853 kg (251 lb) (05/18 0627)  Intake/Output from previous day:   Intake/Output this shift: Total I/O In: 2600 [I.V.:2500; Blood:100] Out: 465 [Urine:150; Drains:15; Blood:300]  Physical exam the patient was, but arousable. She was moving all 4 extremities.  Lab Results: No results found for this basename: WBC, HGB, HCT, PLT,  in the last 72 hours BMET No results found for this basename: NA, K, CL, CO2, GLUCOSE, BUN, CREATININE, CALCIUM,  in the last 72 hours  Studies/Results: Dg Lumbar Spine 2-3 Views  01/17/2014   CLINICAL DATA:  L4-5 PLIF  EXAM: DG C-ARM 61-120 MIN; LUMBAR SPINE - 2-3 VIEW  COMPARISON:  01/17/2014  FINDINGS: The patient has undergone posterior fusion with interbody fusion device at L4-5. Frontal and lateral views are acquired with posterior retractor in place.  IMPRESSION: L4-5 fusion.   Electronically Signed   By: Rosalie GumsBeth  Brown M.D.   On: 01/17/2014 12:27   Dg Lumbar Spine 1 View  01/17/2014   CLINICAL DATA:  L4-5 PLIF  EXAM: LUMBAR SPINE - 1 VIEW  COMPARISON:  None.  FINDINGS: Lateral portable radiograph is annotated in the lower lumbar spine levels number to. Tissue spreaders are posterior to the L3-4 disc space. There is a surgical probe which is posterior to the superior endplate of L4. Marland Kitchen.  IMPRESSION: 1. Portable intraoperative radiographs performed for probe localization.   Electronically Signed   By: Signa Kellaylor  Stroud M.D.   On: 01/17/2014 09:04   Dg C-arm 1-60  Min  01/17/2014   CLINICAL DATA:  L4-5 PLIF  EXAM: DG C-ARM 61-120 MIN; LUMBAR SPINE - 2-3 VIEW  COMPARISON:  01/17/2014  FINDINGS: The patient has undergone posterior fusion with interbody fusion device at L4-5. Frontal and lateral views are acquired with posterior retractor in place.  IMPRESSION: L4-5 fusion.   Electronically Signed   By: Rosalie GumsBeth  Brown M.D.   On: 01/17/2014 12:27    Assessment/Plan: The patient is doing well.  LOS: 0 days     Cristi LoronJeffrey D [name] 01/17/2014, 3:23 PM

## 2014-01-17 NOTE — H&P (Signed)
Subjective: The patient is a 58 year old black female who is complaining of back, buttock, and leg pain consistent with neurogenic claudication. She has failed medical management and was worked up with x-rays and a lumbar MRI. This demonstrated an L4-5 spondylolisthesis with spinal stenosis. I discussed the various treatment options with the patient including surgery. She has weighed the risks, benefits, and alternatives surgery decided proceed with a L4-L5 decompression, instrumentation, and fusion.   Past Medical History  Diagnosis Date  . Hypertension   . COPD (chronic obstructive pulmonary disease)   . Bipolar 1 disorder   . Arthritis   . Sleep apnea   . Diabetes mellitus without complication     fasting 80-90  . History of panic attacks     takes depakote for this    Past Surgical History  Procedure Laterality Date  . Joint replacement Left     hip     No Known Allergies  History  Substance Use Topics  . Smoking status: Current Every Day Smoker -- 0.50 packs/day for 30 years    Types: Cigarettes  . Smokeless tobacco: Not on file  . Alcohol Use: No    History reviewed. No pertinent family history. Prior to Admission medications   Medication Sig Start Date End Date Taking? Authorizing Provider  citalopram (CELEXA) 40 MG tablet Take 40 mg by mouth daily.   Yes Historical Provider, MD  diclofenac (VOLTAREN) 75 MG EC tablet Take 75 mg by mouth 2 (two) times daily.   Yes Historical Provider, MD  divalproex (DEPAKOTE) 500 MG DR tablet Take 500 mg by mouth 3 (three) times daily.   Yes Historical Provider, MD  glyBURIDE (DIABETA) 2.5 MG tablet Take 2.5 mg by mouth daily with breakfast.   Yes Historical Provider, MD  HYDROcodone-acetaminophen (NORCO) 10-325 MG per tablet Take 1 tablet by mouth every 6 (six) hours as needed for moderate pain.   Yes Historical Provider, MD  lisinopril (PRINIVIL,ZESTRIL) 40 MG tablet Take 40 mg by mouth daily.   Yes Historical Provider, MD   methocarbamol (ROBAXIN) 500 MG tablet Take 500 mg by mouth every 8 (eight) hours as needed for muscle spasms.   Yes Historical Provider, MD  oxycodone (OXY-IR) 5 MG capsule Take 10 mg by mouth every 6 (six) hours as needed.   Yes Historical Provider, MD  predniSONE (DELTASONE) 20 MG tablet Take 20 mg by mouth 2 (two) times daily. 12/06/12  Yes Flint MelterElliott L Wentz, MD     Review of Systems  Positive ROS: As above  All other systems have been reviewed and were otherwise negative with the exception of those mentioned in the HPI and as above.  Objective: Vital signs in last 24 hours: Temp:  [99.7 F (37.6 C)] 99.7 F (37.6 C) (05/18 0627) Pulse Rate:  [75] 75 (05/18 0627) Resp:  [20] 20 (05/18 0627) BP: (134)/(65) 134/65 mmHg (05/18 0627) SpO2:  [100 %] 100 % (05/18 0627) Weight:  [113.853 kg (251 lb)] 113.853 kg (251 lb) (05/18 0627)  General Appearance: Alert, cooperative, no distress, Head: Normocephalic, without obvious abnormality, atraumatic Eyes: PERRL, conjunctiva/corneas clear, EOM's intact,    Ears: Normal  Throat: Normal  Neck: Supple, symmetrical, trachea midline, no adenopathy; thyroid: No enlargement/tenderness/nodules; no carotid bruit or JVD Back: Symmetric, no curvature, ROM normal, no CVA tenderness Lungs: Clear to auscultation bilaterally, respirations unlabored Heart: Regular rate and rhythm, no murmur, rub or gallop Abdomen: Soft, non-tender,, no masses, no organomegaly Extremities: Extremities normal, atraumatic, no cyanosis or edema Pulses:  2+ and symmetric all extremities Skin: Skin color, texture, turgor normal, no rashes or lesions  NEUROLOGIC:   Mental status: alert and oriented, no aphasia, good attention span, Fund of knowledge/ memory ok Motor Exam - grossly normal Sensory Exam - grossly normal Reflexes:  Coordination - grossly normal Gait - grossly normal Balance - grossly normal Cranial Nerves: I: smell Not tested  II: visual acuity  OS: Normal   OD: Normal   II: visual fields Full to confrontation  II: pupils Equal, round, reactive to light  III,VII: ptosis None  III,IV,VI: extraocular muscles  Full ROM  V: mastication Normal  V: facial light touch sensation  Normal  V,VII: corneal reflex  Present  VII: facial muscle function - upper  Normal  VII: facial muscle function - lower Normal  VIII: hearing Not tested  IX: soft palate elevation  Normal  IX,X: gag reflex Present  XI: trapezius strength  5/5  XI: sternocleidomastoid strength 5/5  XI: neck flexion strength  5/5  XII: tongue strength  Normal    Data Review Lab Results  Component Value Date   WBC 6.3 01/10/2014   HGB 11.8* 01/10/2014   HCT 38.4 01/10/2014   MCV 93.4 01/10/2014   PLT 205 01/10/2014   Lab Results  Component Value Date   NA 142 01/10/2014   K 3.8 01/10/2014   CL 103 01/10/2014   CO2 28 01/10/2014   BUN 13 01/10/2014   CREATININE 0.65 01/10/2014   GLUCOSE 166* 01/10/2014   No results found for this basename: INR, PROTIME    Assessment/Plan: L4-5 spondylolisthesis, spinal stenosis, lumbago, lumbar radiculopathy, neurogenic claudication: I have discussed the situation with the patient. I have reviewed her imaging studies with her and pointed out the abnormalities. We have discussed the various treatment options including surgery. I have described the surgical treatment option of an L4-L5 decompression, instrumentation, and fusion. I have shown her surgical models. We have discussed the risks, benefits, alternatives, and likelihood of achieving our goals with surgery. I have answered all her questions. She has decided proceed with surgery.   Cristi LoronJeffrey D Gaberial Cada 01/17/2014 7:22 AM

## 2014-01-17 NOTE — Transfer of Care (Addendum)
Immediate Anesthesia Transfer of Care Note  Patient: Donna Chase  Procedure(s) Performed: Procedure(s) with comments: POSTERIOR LUMBAR FUSION 1 LEVEL (N/A) - L45 laminectomy with posterior lumbar interbody fusion with interbody prosthesis posterior lateral arthrodesis and posterior nonsegmental instrumentation  Patient Location: PACU  Anesthesia Type:General  Level of Consciousness: awake, patient cooperative and lethargic  Airway & Oxygen Therapy: Patient Spontanous Breathing and Patient connected to face mask oxygen  Post-op Assessment: Report given to PACU RN and Post -op Vital signs reviewed and stable, moves all extremities.   Post vital signs: Reviewed and stable  Complications: No apparent anesthesia complications

## 2014-01-17 NOTE — Progress Notes (Signed)
Orthopedic Tech Progress Note Patient Details:  Donna Robinsonsriscilla Trageser [birth year] [id number]  Patient ID: Donna RobinsonsPriscilla Chase, female   DOB: [birth year], 58 y.o.   MRN: [id number] Brace order completed by bio-tech  Kadden Osterhout 01/17/2014, 3:09 PM

## 2014-01-17 NOTE — Op Note (Signed)
Brief history: The patient is a 58 year old black female who has complained of back, buttock, and leg pain consistent with neurogenic claudication. She has failed medical management was worked up with a lumbar MRI and x-rays. These studies demonstrated L4-5 spondylolisthesis, facet arthropathy, spinal stenosis, et Donna Chase. I discussed the various treatment options with the patient including surgery. She has weighed the risks, benefits, and alternatives surgery and decided proceed with an L4-5 decompression, instrumentation, and fusion.  Preoperative diagnosis: L4-5 spondylolisthesis, Degenerative disc disease, spinal stenosis compressing both the L4 and the L5 nerve roots; lumbago; lumbar radiculopathy  Postoperative diagnosis: The same  Procedure: Bilateral L4 Laminotomy/foraminotomies to decompress the bilateral L4 and L5 nerve roots(the work required to do this was in addition to the work required to do the posterior lumbar interbody fusion because of the patient's spinal stenosis, facet arthropathy. Etc. requiring a wide decompression of the nerve roots.); L4-5 posterior lumbar interbody fusion with local morselized autograft bone and Actifusebone graft extender; insertion of interbody prosthesis at L4-5 (globus peek interbody prosthesis); posterior nonsegmental instrumentation from L4 to L5 with globus titanium pedicle screws and rods; posterior lateral arthrodesis at L4-5 with local morselized autograft bone and Vitoss bone graft extender.  Surgeon: Dr. Delma OfficerJeff Dedee Liss  Asst.: Dr. Jillyn HiddenGary cram  Anesthesia: Gen. endotracheal  Estimated blood loss: 250 cc  Drains: One medium Hemovac  Complications: None  Description of procedure: The patient was brought to the operating room by the anesthesia team. General endotracheal anesthesia was induced. The patient was turned to the prone position on the Wilson frame. The patient's lumbosacral region was then prepared with Betadine scrub and Betadine solution.  Sterile drapes were applied.  I then injected the area to be incised with Marcaine with epinephrine solution. I then used the scalpel to make a linear midline incision over the L4-5 interspace. I then used electrocautery to perform a bilateral subperiosteal dissection exposing the spinous process and lamina of L4 and L5. We then obtained intraoperative radiograph to confirm our location. We then inserted the Verstrac retractor to provide exposure.  I began the decompression by using the high speed drill to perform laminotomies at L4. We then used the Kerrison punches to widen the laminotomy and removed the ligamentum flavum at L4-5. We used the Kerrison punches to remove the medial facets at L4-5. We performed wide foraminotomies about the bilateral L4 and L5 nerve roots completing the decompression.  We now turned our attention to the posterior lumbar interbody fusion. I used a scalpel to incise the intervertebral disc at L4-5. I then performed a partial intervertebral discectomy at L4-5 using the pituitary forceps. We prepared the vertebral endplates at L4-5 for the fusion by removing the soft tissues with the curettes. We then used the trial spacers to pick the appropriate sized interbody prosthesis. We prefilled his prosthesis with a combination of local morselized autograft bone that we obtained during the decompression as well as Actifuse bone graft extender. We inserted the prefilled prosthesis into the interspace at L4-5. There was a good snug fit of the prosthesis in the interspace. We then filled and the remainder of the intervertebral disc space with local morselized autograft bone and Actifuse. This completed the posterior lumbar interbody arthrodesis.  We now turned attention to the instrumentation. Under fluoroscopic guidance we cannulated the bilateral L4 and L5 pedicles with the bone probe. We then removed the bone probe. We then tapped the pedicle with a 0.5 millimeter tap. We then removed  the tap. We probed inside  the tapped pedicle with a ball probe to rule out cortical breaches. We then inserted a 7.5 x 50 millimeter pedicle screw into the L4 and L5 pedicles bilaterally under fluoroscopic guidance. We then palpated along the medial aspect of the pedicles to rule out cortical breaches. There were none. The nerve roots were not injured. We then connected the unilateral pedicle screws with a lordotic rod. We compressed the construct and secured the rod in place with the caps. We then tightened the caps appropriately. This completed the instrumentation from L4-5.  We now turned our attention to the posterior lateral arthrodesis at L4-5. We used the high-speed drill to decorticate the remainder of the facets, pars, transverse process at L4-5. We then applied a combination of local morselized autograft bone and Vitoss bone graft extender over these decorticated posterior lateral structures. This completed the posterior lateral arthrodesis.  We then obtained hemostasis using bipolar electrocautery. We irrigated the wound out with bacitracin solution. We inspected the thecal sac and nerve roots and noted they were well decompressed. We then removed the retractor. We placed a medium Hemovac drain in the epidural space and tunneled out through separate stab wound. We reapproximated patient's thoracolumbar fascia with interrupted #1 Vicryl suture. We reapproximated patient's subcutaneous tissue with interrupted 2-0 Vicryl suture. The reapproximated patient's skin with Steri-Strips and benzoin. The wound was then coated with bacitracin ointment. A sterile dressing was applied. The drapes were removed. The patient was subsequently returned to the supine position where they were extubated by the anesthesia team. He was then transported to the post anesthesia care unit in stable condition. All sponge instrument and needle counts were reportedly correct at the end of this case.

## 2014-01-17 NOTE — Anesthesia Postprocedure Evaluation (Signed)
  Anesthesia Post-op Note  Patient: Hart RobinsonsPriscilla Lemelin  Procedure(s) Performed: Procedure(s) with comments: POSTERIOR LUMBAR FUSION 1 LEVEL (N/A) - L45 laminectomy with posterior lumbar interbody fusion with interbody prosthesis posterior lateral arthrodesis and posterior nonsegmental instrumentation  Patient Location: PACU  Anesthesia Type:General  Level of Consciousness: awake  Airway and Oxygen Therapy: Patient Spontanous Breathing  Post-op Pain: mild  Post-op Assessment: Post-op Vital signs reviewed  Post-op Vital Signs: Reviewed  Last Vitals:  Filed Vitals:   01/17/14 1300  BP: 109/83  Pulse: 81  Temp: 37.2 C  Resp: 14    Complications: No apparent anesthesia complications

## 2014-01-18 LAB — GLUCOSE, CAPILLARY
GLUCOSE-CAPILLARY: 175 mg/dL — AB (ref 70–99)
GLUCOSE-CAPILLARY: 177 mg/dL — AB (ref 70–99)
GLUCOSE-CAPILLARY: 132 mg/dL — AB (ref 70–99)
Glucose-Capillary: 181 mg/dL — ABNORMAL HIGH (ref 70–99)
Glucose-Capillary: 170 mg/dL — ABNORMAL HIGH (ref 70–99)
Glucose-Capillary: 201 mg/dL — ABNORMAL HIGH (ref 70–99)
Glucose-Capillary: 154 mg/dL — ABNORMAL HIGH (ref 70–99)

## 2014-01-18 LAB — CBC
HCT: 34.4 % — ABNORMAL LOW (ref 36.0–46.0)
Hemoglobin: 10.9 g/dL — ABNORMAL LOW (ref 12.0–15.0)
MCH: 29.5 pg (ref 26.0–34.0)
MCHC: 31.7 g/dL (ref 30.0–36.0)
MCV: 93 fL (ref 78.0–100.0)
PLATELETS: 222 10*3/uL (ref 150–400)
RBC: 3.7 MIL/uL — ABNORMAL LOW (ref 3.87–5.11)
RDW: 13.3 % (ref 11.5–15.5)
WBC: 9.4 10*3/uL (ref 4.0–10.5)

## 2014-01-18 LAB — BASIC METABOLIC PANEL
BUN: 16 mg/dL (ref 6–23)
CALCIUM: 8.7 mg/dL (ref 8.4–10.5)
CO2: 27 mEq/L (ref 19–32)
CREATININE: 0.81 mg/dL (ref 0.50–1.10)
Chloride: 105 mEq/L (ref 96–112)
GFR calc non Af Amer: 79 mL/min — ABNORMAL LOW (ref >90)
Glucose, Bld: 160 mg/dL — ABNORMAL HIGH (ref 70–99)
Potassium: 4.8 mEq/L (ref 3.7–5.3)
Sodium: 143 mEq/L (ref 137–147)

## 2014-01-18 NOTE — Evaluation (Signed)
Physical Therapy Evaluation Patient Details Name: Donna Chase MRN: [id number] DOB: 1956-05-10 Today's Date: 01/18/2014   History of Present Illness  s/p posterior lumbar fusion 1 level   Clinical Impression   Pt admitted with/for lumbar fusion.  Pt currently limited functionally due to the problems listed. ( See problems list.)   Pt will benefit from PT to maximize function and safety in order to get ready for next venue listed below.      Follow Up Recommendations CIR    Equipment Recommendations  Rolling walker with 5" wheels;3in1 (PT)    Recommendations for Other Services       Precautions / Restrictions Precautions Precautions: Back Required Braces or Orthoses: Spinal Brace Spinal Brace: Applied in sitting position;Lumbar corset Restrictions Weight Bearing Restrictions: No      Mobility  Bed Mobility Overal bed mobility: Needs Assistance Bed Mobility: Sidelying to Sit;Rolling Rolling: Min assist Sidelying to sit: Min guard;HOB elevated       General bed mobility comments: Reinforced best logroll technique: som assist to roll and no assist side to sit.  Transfers Overall transfer level: Needs assistance Equipment used: Rolling walker (2 wheeled) Transfers: Sit to/from Stand Sit to Stand: Min guard;From elevated surface         General transfer comment: reinforce safe technique and hand placement.   Ambulation/Gait Ambulation/Gait assistance: Min guard Ambulation Distance (Feet): 180 Feet Assistive device: Rolling walker (2 wheeled) Gait Pattern/deviations: Step-through pattern     General Gait Details: steady with good speed, mildly flexed posture  Stairs            Wheelchair Mobility    Modified Rankin (Stroke Patients Only)       Balance Overall balance assessment: No apparent balance deficits (not formally assessed)   Sitting balance-Leahy Scale: Fair       Standing balance-Leahy Scale: Fair                                Pertinent Vitals/Pain     Home Living Family/patient expects to be discharged to:: Private residence Living Arrangements: Children Available Help at Discharge: Available PRN/intermittently Type of Home: House Home Access: Stairs to enter   [employer] Corporation[employer]trance Stairs-Number of Steps: 4 front entrance, 2 back entrance Home Layout: One level        Prior Function Level of Independence: Independent               Hand Dominance        Extremity/Trunk Assessment               Lower Extremity Assessment: Generalized weakness;LLE deficits/detail   LLE Deficits / Details: hip flexor weakness 3+/5     Communication   Communication: No difficulties  Cognition Arousal/Alertness: Awake/alert Behavior During Therapy: WFL for tasks assessed/performed Overall Cognitive Status: Within Functional Limits for tasks assessed                      General Comments      Exercises        Assessment/Plan    PT Assessment Patient needs continued PT services  PT Diagnosis Acute pain;Generalized weakness   PT Problem List Decreased strength;Decreased activity tolerance;Decreased mobility;Decreased knowledge of use of DME;Decreased knowledge of precautions;Pain  PT Treatment Interventions Gait training;Stair training;Functional mobility training;Therapeutic activities;Patient/family education;DME instruction   PT Goals (Current goals can be found in the Care Plan section) Acute Rehab PT Goals Patient Stated  Goal: to be able to lift my left leg up more, especially when I walk  PT Goal Formulation: With patient Time For Goal Achievement: 01/25/14 Potential to Achieve Goals: Good    Frequency Min 5X/week   Barriers to discharge Decreased caregiver support      Co-evaluation               End of Session Equipment Utilized During Treatment: Back brace Activity Tolerance: Patient tolerated treatment well Patient left: in chair;with call bell/phone  within reach Nurse Communication: Mobility status         Time: [id number] PT Time Calculation (min): 17 min   Charges:   PT Evaluation $Initial PT Evaluation Tier I: 1 Procedure PT Treatments $Gait Training: 8-22 mins   PT G CodesEliseo Chase:          Donna Chase 01/18/2014, 5:23 PM 01/18/2014  Donna Chase, PT [phone]  (pager)

## 2014-01-18 NOTE — Evaluation (Signed)
Occupational Therapy Evaluation Patient Details Name: Donna Robinsonsriscilla Gail MRN: [id number] DOB: 06-17-1956 Today's Date: 01/18/2014    History of Present Illness s/p posterior lumbar fusion 1 level    Clinical Impression   Pt admitted with the above diagnoses and presents with below problem list. Pt will benefit from continued acute OT to address the below listed deficits and maximize independence with basic ADLs prior to d/c. Pt min A for rolling; min A - min guard for OOB ADLs. Pt has decreased cg support and has a strong desire and motivation to participate in further rehab prior to d/c home. Recommending CIR and feel pt would be a great candidate for CIR.    Follow Up Recommendations  CIR    Equipment Recommendations  Other (comment) (defer to next venue)    Recommendations for Other Services Rehab consult     Precautions / Restrictions Precautions Precautions: Back Required Braces or Orthoses: Spinal Brace Spinal Brace: Applied in sitting position;Lumbar corset Restrictions Weight Bearing Restrictions: No      Mobility Bed Mobility Overal bed mobility: Needs Assistance Bed Mobility: Sidelying to Sit;Rolling Rolling: Min assist Sidelying to sit: Min guard;HOB elevated       General bed mobility comments: Pt stated difficulty with bed mobility since surgery  Transfers Overall transfer level: Needs assistance Equipment used: Rolling walker (2 wheeled) Transfers: Sit to/from Stand Sit to Stand: Min guard;From elevated surface              Balance Overall balance assessment: Needs assistance Sitting-balance support: No upper extremity supported;Feet unsupported Sitting balance-Leahy Scale: Fair     Standing balance support: Bilateral upper extremity supported;During functional activity Standing balance-Leahy Scale: Poor                              ADL Overall ADL's : Needs assistance/impaired Eating/Feeding: Set up;Sitting   Grooming: Set  up;Sitting   Upper Body Bathing: Sitting;Min guard;Cueing for compensatory techniques;With adaptive equipment   Lower Body Bathing: With adaptive equipment;Sit to/from stand;Min guard   Upper Body Dressing : Minimal assistance;Cueing for compensatory techniques;Sitting Upper Body Dressing Details (indicate cue type and reason): needed assistance to don brace across back  Lower Body Dressing: Sit to/from stand;With adaptive equipment;Min guard;Cueing for compensatory techniques   Toilet Transfer: Min guard;Ambulation;RW (3n1 over toilet)   Toileting- Clothing Manipulation and Hygiene: Minimal assistance;Cueing for back precautions;Sit to/from stand Toileting - Clothing Manipulation Details (indicate cue type and reason): A for clothing manipulation Tub/ Shower Transfer: Min guard;Ambulation;3 in 1;Rolling walker   Functional mobility during ADLs: Min guard;Rolling walker General ADL Comments: Pt completed basic ADLs with min guard to min A. Education on AE and techniques for safe completion of  ADLs. Educated on donning/doffing back brace.      Vision                     Perception     Praxis      Pertinent Vitals/Pain No significant pain noted     Hand Dominance     Extremity/Trunk Assessment Upper Extremity Assessment Upper Extremity Assessment: Generalized weakness;Overall [facility] CenterWFL for tasks assessed   Lower Extremity Assessment Lower Extremity Assessment: Defer to PT evaluation       Communication Communication Communication: No difficulties   Cognition Arousal/Alertness: Awake/alert Behavior During Therapy: WFL for tasks assessed/performed Overall Cognitive Status: Within Functional Limits for tasks assessed  General Comments       Exercises       Shoulder Instructions      Home Living Family/patient expects to be discharged to:: Private residence Living Arrangements: Children Available Help at Discharge: Available  PRN/intermittently Type of Home: House Home Access: Stairs to enter [employer]Entrance Stairs-Number of Steps: 4 front entrance, 2 back entrance   Home Layout: One level     Bathroom Shower/Tub: Chief Strategy OfficerTub/shower unit   Bathroom Toilet: Standard                Prior Functioning/Environment Level of Independence: Independent             OT Diagnosis: Generalized weakness;Acute pain   OT Problem List: Decreased strength;Decreased range of motion;Decreased activity tolerance;Impaired balance (sitting and/or standing);Decreased safety awareness;Decreased knowledge of use of DME or AE;Decreased knowledge of precautions;Obesity;Pain   OT Treatment/Interventions: Self-care/ADL training;Therapeutic exercise;Energy conservation;DME and/or AE instruction;Therapeutic activities;Patient/family education;Balance training    OT Goals(Current goals can be found in the care plan section) Acute Rehab OT Goals Patient Stated Goal: to be able to lift my left leg up more, especially when I walk  OT Goal Formulation: With patient Time For Goal Achievement: 01/25/14 Potential to Achieve Goals: Good ADL Goals Pt Will Perform Lower Body Bathing: with supervision Pt Will Perform Lower Body Dressing: with supervision;with adaptive equipment;sit to/from stand Pt Will Transfer to Toilet: with supervision;ambulating (3n1 over toilet) Pt Will Perform Toileting - Clothing Manipulation and hygiene: sit to/from stand;with supervision Pt Will Perform Tub/Shower Transfer: with supervision;ambulating;3 in 1;rolling walker Additional ADL Goal #1: Pt will complete bed mobility with mod I with HOB flat and without use of handrails to prepare for OOB ADLs.  OT Frequency: Min 2X/week   Barriers to D/C: Decreased caregiver support  son can come by occasionally       Co-evaluation              End of Session Equipment Utilized During Treatment: Gait belt;Rolling walker;Back brace;Oxygen  Activity Tolerance: Patient  tolerated treatment well Patient left: in chair;with call bell/phone within reach   Time: [id number] OT Time Calculation (min): 29 min Charges:  OT General Charges $OT Visit: 1 Procedure OT Evaluation $Initial OT Evaluation Tier I: 1 Procedure OT Treatments $Self Care/Home Management : 8-22 mins G-Codes:    Pilar GrammesKathryn H Kristl Morioka 01/18/2014, 2:07 PM

## 2014-01-18 NOTE — Progress Notes (Signed)
Patient ID: Donna Robinsonsriscilla Englert, female   DOB: 1956-05-20, 58 y.o.   MRN: [id number] Subjective:  The patient is alert and pleasant. She looks well. She is in no apparent distress. She wants to go into rehabilitation.  Objective: Vital signs in last 24 hours: Temp:  [98.7 F (37.1 C)-99.1 F (37.3 C)] 99 F (37.2 C) (05/19 0531) Pulse Rate:  [67-97] 68 (05/19 0531) Resp:  [14-20] 20 (05/19 0531) BP: (109-161)/(63-88) 120/64 mmHg (05/19 0531) SpO2:  [93 %-100 %] 94 % (05/19 0531)  Intake/Output from previous day: 05/18 0701 - 05/19 0700 In: 2600 [I.V.:2500; Blood:100] Out: 1225 [Urine:750; Drains:175; Blood:300] Intake/Output this shift:    Physical exam patient is alert and pleasant. Her strength is grossly normal. Her drain has become disconnected. I removed it. Her dressing is clean and dry.  Lab Results:  Recent Labs  01/18/14 0408  WBC 9.4  HGB 10.9*  HCT 34.4*  PLT 222   BMET  Recent Labs  01/18/14 0408  NA 143  K 4.8  CL 105  CO2 27  GLUCOSE 160*  BUN 16  CREATININE 0.81  CALCIUM 8.7    Studies/Results: Dg Lumbar Spine 2-3 Views  01/17/2014   CLINICAL DATA:  L4-5 PLIF  EXAM: DG C-ARM 61-120 MIN; LUMBAR SPINE - 2-3 VIEW  COMPARISON:  01/17/2014  FINDINGS: The patient has undergone posterior fusion with interbody fusion device at L4-5. Frontal and lateral views are acquired with posterior retractor in place.  IMPRESSION: L4-5 fusion.   Electronically Signed   By: Rosalie GumsBeth  Brown M.D.   On: 01/17/2014 12:27   Dg Lumbar Spine 1 View  01/17/2014   CLINICAL DATA:  L4-5 PLIF  EXAM: LUMBAR SPINE - 1 VIEW  COMPARISON:  None.  FINDINGS: Lateral portable radiograph is annotated in the lower lumbar spine levels number to. Tissue spreaders are posterior to the L3-4 disc space. There is a surgical probe which is posterior to the superior endplate of L4. Marland Kitchen.  IMPRESSION: 1. Portable intraoperative radiographs performed for probe localization.   Electronically Signed   By: Signa Kellaylor   Stroud M.D.   On: 01/17/2014 09:04   Dg C-arm 1-60 Min  01/17/2014   CLINICAL DATA:  L4-5 PLIF  EXAM: DG C-ARM 61-120 MIN; LUMBAR SPINE - 2-3 VIEW  COMPARISON:  01/17/2014  FINDINGS: The patient has undergone posterior fusion with interbody fusion device at L4-5. Frontal and lateral views are acquired with posterior retractor in place.  IMPRESSION: L4-5 fusion.   Electronically Signed   By: Rosalie GumsBeth  Brown M.D.   On: 01/17/2014 12:27    Assessment/Plan: Postop day 1: The patient is doing well. We will mobilize her with PT and OT. The work on rehabilitation versus skilled nursing facility placement.  LOS: 1 day     Cristi LoronJeffrey D Lessly Stigler 01/18/2014, 7:50 AM

## 2014-01-19 LAB — GLUCOSE, CAPILLARY
GLUCOSE-CAPILLARY: 139 mg/dL — AB (ref 70–99)
GLUCOSE-CAPILLARY: 112 mg/dL — AB (ref 70–99)
GLUCOSE-CAPILLARY: 152 mg/dL — AB (ref 70–99)
Glucose-Capillary: 125 mg/dL — ABNORMAL HIGH (ref 70–99)
Glucose-Capillary: 144 mg/dL — ABNORMAL HIGH (ref 70–99)
Glucose-Capillary: 137 mg/dL — ABNORMAL HIGH (ref 70–99)
Glucose-Capillary: 142 mg/dL — ABNORMAL HIGH (ref 70–99)

## 2014-01-19 MED FILL — Sodium Chloride IV Soln 0.9%: INTRAVENOUS | Qty: 2000 | Status: AC

## 2014-01-19 MED FILL — Heparin Sodium (Porcine) Inj 1000 Unit/ML: INTRAMUSCULAR | Qty: 30 | Status: AC

## 2014-01-19 NOTE — Progress Notes (Signed)
Occupational Therapy Treatment Patient Details Name: Donna Chase MRN: [id number] DOB: 1955/11/23 Today's Date: 01/19/2014    History of present illness Pt is 58 y.o Female s/p posterior lumbar fusion 1 level    OT comments  Pt seen today for ADL session. Pt reports high level of pain and requested to use bathroom. Education and training provided for donning/doffing back brace, back precautions, and compensatory techniques. Pt would continue to benefit from skilled OT and rehab in CIR for increased independence prior to d/c home.  Follow Up Recommendations  CIR    Equipment Recommendations  Other (comment) (Defer to next venue)       Precautions / Restrictions Precautions Precautions: Back Required Braces or Orthoses: Spinal Brace Spinal Brace: Applied in sitting position;Lumbar corset Restrictions Weight Bearing Restrictions: No       Mobility Bed Mobility Overal bed mobility: Needs Assistance Bed Mobility: Sidelying to Sit;Rolling;Sit to Sidelying Rolling: Min guard Sidelying to sit: Min assist;HOB elevated ((A) for trunk control)     Sit to sidelying: Min guard General bed mobility comments: Verbal cues for log roll technique. Pt required Min A for sidelying>sit, otherwise min guard.   Transfers Overall transfer level: Needs assistance Equipment used: Rolling walker (2 wheeled) Transfers: Sit to/from Stand Sit to Stand: Min guard         General transfer comment: reinforce safe technique and hand placement.     Balance Overall balance assessment: Needs assistance Sitting-balance support: No upper extremity supported;Feet supported Sitting balance-Leahy Scale: Fair     Standing balance support: Bilateral upper extremity supported;During functional activity Standing balance-Leahy Scale: Fair                     ADL                   Upper Body Dressing : Min guard;Cueing for compensatory techniques;Sitting Upper Body Dressing Details  (indicate cue type and reason): Pt required setup but able to don back brace correctly with verbal cues.     Toilet Transfer: Min guard;Ambulation;BSC;RW   Toileting- Clothing Manipulation and Hygiene: Cueing for back precautions;Sit to/from stand;Min guard         General ADL Comments: Pt requested to use bathroom when OT entered room. Pt ambulated to bathroom with Min guard due to lethargy. Verbal cues for back precaution and hand placement for transfers.                 Cognition  Arousal/Alertness: Lethargic Behavior During Therapy: WFL for tasks assessed/performed Overall Cognitive Status: Within Functional Limits for tasks assessed                                    Pertinent Vitals/ Pain       Pt c/o pain but does not provide pain score. Repositioned in bed for max comfort following OT session.         Frequency Min 2X/week     Progress Toward Goals  OT Goals(current goals can now be found in the care plan section)  Progress towards OT goals: Progressing toward goals  ADL Goals Pt Will Perform Lower Body Bathing: with supervision Pt Will Perform Lower Body Dressing: with supervision;with adaptive equipment;sit to/from stand Pt Will Transfer to Toilet: with supervision;ambulating Pt Will Perform Toileting - Clothing Manipulation and hygiene: sit to/from stand;with supervision Pt Will Perform Tub/Shower Transfer: with supervision;ambulating;3 in 1;rolling walker Additional  ADL Goal #1: Pt will complete bed mobility with mod I with HOB flat and without use of handrails to prepare for OOB ADLs.  Plan Discharge plan remains appropriate       End of Session Equipment Utilized During Treatment: Gait belt;Rolling walker;Back brace   Activity Tolerance Patient limited by lethargy   Patient Left in bed;with call bell/phone within reach           Time: 1550-1607 OT Time Calculation (min): 17 min  Charges: OT General Charges $OT Visit: 1  Procedure OT Treatments $Self Care/Home Management : 8-22 mins  Donna LipsLeeann M Bambie Pizzolato [phone] 01/19/2014, 4:32 PM

## 2014-01-19 NOTE — Progress Notes (Signed)
Patient ID: Donna Chase, female   DOB: [birth year], 58 y.o.   MRN: [id number] Subjective:  The patient is alert and pleasant. She looks well. She is awaiting rehabilitation placement.  Objective: Vital signs in last 24 hours: Temp:  [97.9 F (36.6 C)-99.4 F (37.4 C)] 99.4 F (37.4 C) (05/20 0609) Pulse Rate:  [66-79] 70 (05/20 0609) Resp:  [16-22] 22 (05/20 0609) BP: (100-137)/(58-76) 100/62 mmHg (05/20 0609) SpO2:  [94 %-100 %] 100 % (05/20 0609)  Intake/Output from previous day: 05/19 0701 - 05/20 0700 In: 960 [P.O.:960] Out: -  Intake/Output this shift:    Physical exam this is alert and oriented. Her strength is normal.  Lab Results:  Recent Labs  01/18/14 0408  WBC 9.4  HGB 10.9*  HCT 34.4*  PLT 222   BMET  Recent Labs  01/18/14 0408  NA 143  K 4.8  CL 105  CO2 27  GLUCOSE 160*  BUN 16  CREATININE 0.81  CALCIUM 8.7    Studies/Results: Dg Lumbar Spine 2-3 Views  01/17/2014   CLINICAL DATA:  L4-5 PLIF  EXAM: DG C-ARM 61-120 MIN; LUMBAR SPINE - 2-3 VIEW  COMPARISON:  01/17/2014  FINDINGS: The patient has undergone posterior fusion with interbody fusion device at L4-5. Frontal and lateral views are acquired with posterior retractor in place.  IMPRESSION: L4-5 fusion.   Electronically Signed   By: Rosalie GumsBeth  Brown M.D.   On: 01/17/2014 12:27   Dg Lumbar Spine 1 View  01/17/2014   CLINICAL DATA:  L4-5 PLIF  EXAM: LUMBAR SPINE - 1 VIEW  COMPARISON:  None.  FINDINGS: Lateral portable radiograph is annotated in the lower lumbar spine levels number to. Tissue spreaders are posterior to the L3-4 disc space. There is a surgical probe which is posterior to the superior endplate of L4. Marland Kitchen.  IMPRESSION: 1. Portable intraoperative radiographs performed for probe localization.   Electronically Signed   By: Signa Kellaylor  Stroud M.D.   On: 01/17/2014 09:04   Dg C-arm 1-60 Min  01/17/2014   CLINICAL DATA:  L4-5 PLIF  EXAM: DG C-ARM 61-120 MIN; LUMBAR SPINE - 2-3 VIEW  COMPARISON:   01/17/2014  FINDINGS: The patient has undergone posterior fusion with interbody fusion device at L4-5. Frontal and lateral views are acquired with posterior retractor in place.  IMPRESSION: L4-5 fusion.   Electronically Signed   By: Rosalie GumsBeth  Brown M.D.   On: 01/17/2014 12:27    Assessment/Plan: Postop day #2: The patient is stable for transfer to a skilled nursing facility from my point of view. We are awaiting arrangements.  LOS: 2 days     Cristi LoronJeffrey D Colleen Kotlarz 01/19/2014, 7:37 AM

## 2014-01-19 NOTE — Progress Notes (Signed)
Physical Therapy Treatment Patient Details Name: Donna Chase MRN: [id number] DOB: May 03, 1956 Today's Date: 01/19/2014    History of Present Illness s/p posterior lumbar fusion 1 level     PT Comments    Limited today due to pain.  Reinforced all education, but in too much pain to internalize it.  Follow Up Recommendations  CIR     Equipment Recommendations  Rolling walker with 5" wheels;3in1 (PT)    Recommendations for Other Services       Precautions / Restrictions Precautions Precautions: Back Required Braces or Orthoses: Spinal Brace Spinal Brace: Applied in sitting position;Lumbar corset Restrictions Weight Bearing Restrictions: No    Mobility  Bed Mobility Overal bed mobility: Needs Assistance Bed Mobility: Sidelying to Sit;Rolling;Sit to Sidelying Rolling: Min assist Sidelying to sit: Min assist     Sit to sidelying: Min assist General bed mobility comments: Reinforced best logroll technique: some assist to roll and no assist side to sit.  Transfers Overall transfer level: Needs assistance Equipment used: Rolling walker (2 wheeled) Transfers: Sit to/from Stand Sit to Stand: Min guard         General transfer comment: reinforce safe technique and hand placement.   Ambulation/Gait Ambulation/Gait assistance: Min assist Ambulation Distance (Feet): 110 Feet Assistive device: Rolling walker (2 wheeled) Gait Pattern/deviations: Step-through pattern;Drifts right/left (staggering steps at times)     General Gait Details: More painfull, unsteady with staggering steps and wandering with the RW.   Stairs            Wheelchair Mobility    Modified Rankin (Stroke Patients Only)       Balance Overall balance assessment: Needs assistance Sitting-balance support: Feet supported Sitting balance-Leahy Scale: Fair     Standing balance support: Bilateral upper extremity supported Standing balance-Leahy Scale: Fair                       Cognition Arousal/Alertness: Awake/alert Behavior During Therapy: WFL for tasks assessed/performed Overall Cognitive Status: Within Functional Limits for tasks assessed                      Exercises      General Comments        Pertinent Vitals/Pain 10/10     Home Living                      Prior Function            PT Goals (current goals can now be found in the care plan section) Acute Rehab PT Goals Patient Stated Goal: to be able to lift my left leg up more, especially when I walk  PT Goal Formulation: With patient Time For Goal Achievement: 01/25/14 Potential to Achieve Goals: Good Progress towards PT goals: Progressing toward goals    Frequency  Min 5X/week    PT Plan Current plan remains appropriate    Co-evaluation             End of Session Equipment Utilized During Treatment: Back brace Activity Tolerance: Patient limited by pain Patient left: in bed;with call bell/phone within reach     Time: 1125-1145 PT Time Calculation (min): 20 min  Charges:  $Gait Training: 8-22 mins                    G CodesEliseo Gum:      [name] 01/19/2014, 11:54 AM 01/19/2014  [place] BingKen Marvetta Vohs, PT [phone]  (pager)

## 2014-01-20 LAB — GLUCOSE, CAPILLARY
GLUCOSE-CAPILLARY: 117 mg/dL — AB (ref 70–99)
GLUCOSE-CAPILLARY: 138 mg/dL — AB (ref 70–99)
GLUCOSE-CAPILLARY: 208 mg/dL — AB (ref 70–99)
GLUCOSE-CAPILLARY: 85 mg/dL (ref 70–99)
Glucose-Capillary: 102 mg/dL — ABNORMAL HIGH (ref 70–99)
Glucose-Capillary: 171 mg/dL — ABNORMAL HIGH (ref 70–99)
Glucose-Capillary: 245 mg/dL — ABNORMAL HIGH (ref 70–99)

## 2014-01-20 NOTE — Progress Notes (Signed)
Soc Worker [place] - and is working on it; Leonard SchwartzB [employer] CorporationChandler RN,BSN,MHA [phone]

## 2014-01-20 NOTE — Clinical Social Work Placement (Addendum)
Clinical Social Work Department CLINICAL SOCIAL WORK PLACEMENT NOTE 01/20/2014  Patient:  Donna Chase,Donna Chase  Account Number:  [id number] Admit date:  01/17/2014  Clinical Social Worker:  Irving BurtonEMILY SUMMERVILLE, LCSWA  Date/time:  01/20/2014 06:45 PM  Clinical Social Work is seeking post-discharge placement for this patient at the following level of care:   SKILLED NURSING   (*CSW will update this form in Epic as items are completed)   01/20/2014  Patient/family provided with [facility] Department of Clinical Social Works list of facilities offering this level of care within the geographic area requested by the patient (or if unable, by the patients family).  01/20/2014  Patient/family informed of their freedom to choose among providers that offer the needed level of care, that participate in Medicare, Medicaid or managed care program needed by the patient, have an available bed and are willing to accept the patient.  01/20/2014  Patient/family informed of [facility] ownership interest in [facility], as well as of the fact that they are under no obligation to receive care at this facility.  PASARR submitted to EDS on 01/20/2014 PASARR number received from EDS on   FL2 transmitted to all facilities in geographic area requested by pt/family on  01/20/2014 FL2 transmitted to all facilities within larger geographic area on   Patient informed that his/her managed care company has contracts with or will negotiate with  certain facilities, including the following:     Patient/family informed of bed offers received:  01/21/2014 [facility] Physician recommends and patient chooses bed at    Patient to be transferred to  on  01/21/2014 Patient to be transferred to facility by [facility]  The following physician request were entered in Epic:   Additional Comments:   Darlyn ChamberEmily Summerville, Theresia MajorsLCSWA Clinical Social Worker [phone]

## 2014-01-20 NOTE — Clinical Social Work Note (Signed)
CSW has met with pt regarding SNF placement. Per pt, pt has recently moved to [place] from [place], [place] with her son. CSW pursuing SNF placement in [facility]. Full assessment to follow.  Donna Chase, [place] Social Worker [phone]

## 2014-01-20 NOTE — Clinical Social Work Psychosocial (Signed)
Clinical Social Work Department BRIEF PSYCHOSOCIAL ASSESSMENT 01/20/2014  Patient:  Donna Chase,Donna Chase     Account Number:  [id number]     Admit date:  01/17/2014  Clinical Social Worker:  Sherre LainSUMMERVILLE,EMILY, LCSWA  Date/Time:  01/20/2014 06:41 PM  Referred by:  Physician  Date Referred:  01/20/2014 Referred for  SNF Placement   Other Referral:   none.   Interview type:  Patient Other interview type:   none.    PSYCHOSOCIAL DATA Living Status:  FAMILY Admitted from facility:   Level of care:   Primary support name:  Donna Chase Primary support relationship to patient:  CHILD, ADULT Degree of support available:   Adequate support system. Pt states her son can not care for her at home.    CURRENT CONCERNS Current Concerns  Post-Acute Placement   Other Concerns:   none.    SOCIAL WORK ASSESSMENT / PLAN CS[facility] to be discharged to SNF once medically stable. CSW spoke with pt regarding SNF placement. [place] and would like [place] [facility].    CSW to continue to follow and assist with discharge planning needs.   Assessment/plan status:  Psychosocial Support/Ongoing Assessment of Needs Other assessment/ plan:   none.   Information/referral to community resources:   [place].    PATIENTS/FAMILYS RESPONSE TO PLAN OF CARE: Pt understanding and agreeable to CSW plan of care.       Darlyn ChamberEmily [place], LCSWA Clinical Social Worker [phone]

## 2014-01-20 NOTE — Progress Notes (Signed)
Physical Therapy Treatment Patient Details Name: Donna Robinsonsriscilla Milner MRN: [id number] DOB: 1956-08-12 Today's Date: 01/20/2014    History of Present Illness Pt is 58 y.o Female s/p posterior lumbar fusion 1 level     PT Comments    Progressing slowly.  Much less limited by her pain today.  Emphasized more coordinated heel/toe pattern on the L LE.  Follow Up Recommendations  SNF (pt is for SNF at this time.)     Equipment Recommendations  Rolling walker with 5" wheels;3in1 (PT)    Recommendations for Other Services       Precautions / Restrictions Precautions Precautions: Back Required Braces or Orthoses: Spinal Brace Spinal Brace: Applied in sitting position;Lumbar corset Restrictions Weight Bearing Restrictions: No    Mobility  Bed Mobility Overal bed mobility: Needs Assistance                Transfers Overall transfer level: Needs assistance Equipment used: Rolling walker (2 wheeled) Transfers: Sit to/from Stand Sit to Stand: Min assist         General transfer comment: reinforce safe technique and hand placement.   Ambulation/Gait Ambulation/Gait assistance: Min guard;Min assist Ambulation Distance (Feet): 300 Feet Assistive device: Rolling walker (2 wheeled) Gait Pattern/deviations: Step-through pattern   Gait velocity interpretation: Below normal speed for age/gender General Gait Details: Pt still have some difficulty bringing L LE through without rotating L hip forward.  Worked on more mild translation of L hip forward with more toe off and swing through.   Stairs            Wheelchair Mobility    Modified Rankin (Stroke Patients Only)       Balance Overall balance assessment: Needs assistance Sitting-balance support: No upper extremity supported Sitting balance-Leahy Scale: Fair       Standing balance-Leahy Scale: Fair                      Cognition Arousal/Alertness: Lethargic Behavior During Therapy: WFL for tasks  assessed/performed Overall Cognitive Status: Within Functional Limits for tasks assessed                      Exercises      General Comments        Pertinent Vitals/Pain     Home Living                      Prior Function            PT Goals (current goals can now be found in the care plan section) Acute Rehab PT Goals Patient Stated Goal: to be able to lift my left leg up more, especially when I walk  PT Goal Formulation: With patient Time For Goal Achievement: 01/25/14 Potential to Achieve Goals: Good Progress towards PT goals: Progressing toward goals    Frequency  Min 5X/week    PT Plan Current plan remains appropriate    Co-evaluation             End of Session Equipment Utilized During Treatment: Back brace Activity Tolerance: Patient tolerated treatment well Patient left: in chair;with call bell/phone within reach     Time: 1310-1327 PT Time Calculation (min): 17 min  Charges:  $Gait Training: 8-22 mins                    G CodesEliseo Chase:      Donna Chase 01/20/2014, 1:39 PM 01/20/2014  Donna [place] BingKen Thessaly Chase, PT  [phone]  (pager)

## 2014-01-20 NOTE — Progress Notes (Signed)
Patient ID: Hart Robinsonsriscilla Blatchley, female   DOB: 1956/01/30, 58 y.o.   MRN: [id number] Subjective:  The patient is alert and pleasant. She looks well. She is [place] skilled nursing facility [place] [facility] [facility].  Objective: Vital signs in last 24 hours: Temp:  [97.6 F (36.4 C)-98.5 F (36.9 C)] 98.5 F (36.9 C) (05/21 1437) Pulse Rate:  [64-72] 64 (05/21 1437) Resp:  [18-20] 18 (05/21 1437) BP: (108-158)/(61-76) 136/76 mmHg (05/21 1437) SpO2:  [90 %-100 %] 92 % (05/21 1437)  Intake/Output from previous day: 05/20 0701 - 05/21 0700 In: 980 [P.O.:980] Out: -  Intake/Output this shift:    Physical exam the patient is alert and oriented. She is doing well. Her strength is normal in her lower extremities.  Lab Results:  Recent Labs  01/18/14 0408  WBC 9.4  HGB 10.9*  HCT 34.4*  PLT 222   BMET  Recent Labs  01/18/14 0408  NA 143  K 4.8  CL 105  CO2 27  GLUCOSE 160*  BUN 16  CREATININE 0.81  CALCIUM 8.7    Studies/Results: No results found.  Assessment/Plan: Postop day #3: The patient is ready for transfer to a skilled nursing facility [place] [facility] [place]. We are awaiting placement.  LOS: 3 days     Cristi LoronJeffrey D Hisae Decoursey 01/20/2014, 4:30 PM

## 2014-01-21 ENCOUNTER — Other Ambulatory Visit: Payer: Self-pay | Admitting: *Deleted

## 2014-01-21 LAB — GLUCOSE, CAPILLARY
GLUCOSE-CAPILLARY: 86 mg/dL (ref 70–99)
Glucose-Capillary: 95 mg/dL (ref 70–99)
Glucose-Capillary: 127 mg/dL — ABNORMAL HIGH (ref 70–99)

## 2014-01-21 MED ORDER — OXYCODONE HCL 5 MG PO TABS: ORAL_TABLET | ORAL | Status: DC

## 2014-01-21 MED ORDER — OXYCODONE HCL 10 MG PO TABS: 10.0000 mg | ORAL_TABLET | ORAL | Status: DC | PRN

## 2014-01-21 MED ORDER — DIAZEPAM 5 MG PO TABS: 5.0000 mg | ORAL_TABLET | Freq: Four times a day (QID) | ORAL | Status: DC | PRN

## 2014-01-21 MED ORDER — DIAZEPAM 5 MG PO TABS: ORAL_TABLET | ORAL | Status: DC

## 2014-01-21 MED ORDER — DSS 100 MG PO CAPS: 100.0000 mg | ORAL_CAPSULE | Freq: Two times a day (BID) | ORAL | Status: DC

## 2014-01-21 NOTE — Progress Notes (Signed)
Patient being discharged to [facility]. Report called to [place] at 1423. Transport scheduled to arrive at 1500. Patient being sent with discharge packet and prescriptions.No IV site or telemetry.  Patient is stable; assessment as previously charted.

## 2014-01-21 NOTE — Discharge Summary (Signed)
Physician Discharge Summary  Patient ID: Donna Chase MRN: [id number] DOB/AGE: September 05, 1955 58 y.o.  Admit date: 01/17/2014 Discharge date: 01/21/2014  Admission Diagnoses: L4-5 spondylolisthesis, spinal stenosis, facet arthropathy, lumbago, lumbar radiculopathy, neurogenic claudication  Discharge Diagnoses: The same Active Problems:   Spondylolisthesis of lumbar region   Discharged Condition: good  Hospital Course: I performed an L4-5 decompression, instrumentation, and fusion on the patient on 01/17/2014. The surgery went well.  The patient's postoperative course was unremarkable. We had PT and OT see the patient. Arrangements were made for her to go to a skilled nursing facility. She was transferred. The patient was given oral and written discharge instructions. All her questions were answered.  Consults: PT, OT Significant Diagnostic Studies: None Treatments: L4-5 decompression, instrumentation, and fusion. Discharge Exam: Blood pressure 132/85, pulse 72, temperature 98.2 F (36.8 C), temperature source Oral, resp. rate 18, height 5\' 6"  (1.676 m), weight 113.853 kg (251 lb), SpO2 97.00%. The patient is alert and pleasant. She looks well. Her wound is healing well. Her strength is normal in her lower extremities.  Disposition: Skilled nursing facility  Discharge Instructions   Call MD for:  difficulty breathing, headache or visual disturbances    Complete by:  As directed      Call MD for:  extreme fatigue    Complete by:  As directed      Call MD for:  hives    Complete by:  As directed      Call MD for:  persistant dizziness or light-headedness    Complete by:  As directed      Call MD for:  persistant nausea and vomiting    Complete by:  As directed      Call MD for:  redness, tenderness, or signs of infection (pain, swelling, redness, odor or green/yellow discharge around incision site)    Complete by:  As directed      Call MD for:  severe uncontrolled pain     Complete by:  As directed      Call MD for:  temperature >100.4    Complete by:  As directed      Diet - low sodium heart healthy    Complete by:  As directed      Discharge instructions    Complete by:  As directed   Call [phone] for a followup appointment. Take a stool softener while you are using pain medications.     Driving Restrictions    Complete by:  As directed   Do not drive for 2 weeks.     Increase activity slowly    Complete by:  As directed      Lifting restrictions    Complete by:  As directed   Do not lift more than 5 pounds. No excessive bending or twisting.     May shower / Bathe    Complete by:  As directed   He may shower after the pain she is removed 3 days after surgery. Leave the incision alone.     No dressing needed    Complete by:  As directed             Medication List    STOP taking these medications       diclofenac 75 MG EC tablet  Commonly known as:  VOLTAREN     HYDROcodone-acetaminophen 10-325 MG per tablet  Commonly known as:  NORCO     methocarbamol 500 MG tablet  Commonly known as:  ROBAXIN  oxycodone 5 MG capsule  Commonly known as:  OXY-IR  Replaced by:  Oxycodone HCl 10 MG Tabs      TAKE these medications       citalopram 40 MG tablet  Commonly known as:  CELEXA  Take 40 mg by mouth daily.     diazepam 5 MG tablet  Commonly known as:  VALIUM  Take 1 tablet (5 mg total) by mouth every 6 (six) hours as needed for muscle spasms.     divalproex 500 MG DR tablet  Commonly known as:  DEPAKOTE  Take 500 mg by mouth 3 (three) times daily.     DSS 100 MG Caps  Take 100 mg by mouth 2 (two) times daily.     glyBURIDE 2.5 MG tablet  Commonly known as:  DIABETA  Take 2.5 mg by mouth daily with breakfast.     lisinopril 40 MG tablet  Commonly known as:  PRINIVIL,ZESTRIL  Take 40 mg by mouth daily.     Oxycodone HCl 10 MG Tabs  Take 1 tablet (10 mg total) by mouth every 4 (four) hours as needed for moderate pain.      predniSONE 20 MG tablet  Commonly known as:  DELTASONE  Take 20 mg by mouth 2 (two) times daily.         Signed: Cristi Loron 01/21/2014, 11:17 AM

## 2014-01-21 NOTE — Telephone Encounter (Signed)
[facility] 

## 2014-01-21 NOTE — Progress Notes (Signed)
Patient now leaving via [facility]. Pain medication given prior to leaving due to patient stating pain was 8/10. Discharge packet and prescriptions sent with patient to [facility]. Patient verbalized understanding of discharge plan. Assessment as charted.

## 2014-01-31 ENCOUNTER — Other Ambulatory Visit: Payer: Self-pay | Admitting: Internal Medicine

## 2014-04-22 ENCOUNTER — Encounter (HOSPITAL_BASED_OUTPATIENT_CLINIC_OR_DEPARTMENT_OTHER): Payer: Self-pay | Admitting: Emergency Medicine

## 2014-04-22 ENCOUNTER — Emergency Department (HOSPITAL_BASED_OUTPATIENT_CLINIC_OR_DEPARTMENT_OTHER): Payer: Medicare Other

## 2014-04-22 ENCOUNTER — Encounter (HOSPITAL_COMMUNITY): Payer: Self-pay | Admitting: *Deleted

## 2014-04-22 ENCOUNTER — Emergency Department (HOSPITAL_BASED_OUTPATIENT_CLINIC_OR_DEPARTMENT_OTHER)
Admission: EM | Admit: 2014-04-22 | Discharge: 2014-04-22 | Disposition: A | Discharge status: Home / Self Care | Payer: Medicare Other | Attending: Emergency Medicine | Admitting: Emergency Medicine

## 2014-04-22 DIAGNOSIS — J441 Chronic obstructive pulmonary disease with (acute) exacerbation: Secondary | ICD-10-CM | POA: Diagnosis not present

## 2014-04-22 DIAGNOSIS — R0602 Shortness of breath: Secondary | ICD-10-CM | POA: Insufficient documentation

## 2014-04-22 DIAGNOSIS — E119 Type 2 diabetes mellitus without complications: Secondary | ICD-10-CM | POA: Insufficient documentation

## 2014-04-22 DIAGNOSIS — I10 Essential (primary) hypertension: Secondary | ICD-10-CM | POA: Diagnosis not present

## 2014-04-22 LAB — CBC WITH DIFFERENTIAL/PLATELET
Basophils Absolute: 0 10*3/uL (ref 0.0–0.1)
Basophils Relative: 1 % (ref 0–1)
EOS ABS: 0.1 10*3/uL (ref 0.0–0.7)
Eosinophils Relative: 2 % (ref 0–5)
HCT: 38.3 % (ref 36.0–46.0)
HEMOGLOBIN: 12.1 g/dL (ref 12.0–15.0)
LYMPHS ABS: 3.3 10*3/uL (ref 0.7–4.0)
Lymphocytes Relative: 46 % (ref 12–46)
MCH: 29.2 pg (ref 26.0–34.0)
MCHC: 31.6 g/dL (ref 30.0–36.0)
MCV: 92.3 fL (ref 78.0–100.0)
MONOS PCT: 8 % (ref 3–12)
Monocytes Absolute: 0.5 10*3/uL (ref 0.1–1.0)
NEUTROS PCT: 45 % (ref 43–77)
Neutro Abs: 3.2 10*3/uL (ref 1.7–7.7)
Platelets: 218 10*3/uL (ref 150–400)
RBC: 4.15 MIL/uL (ref 3.87–5.11)
RDW: 13 % (ref 11.5–15.5)
WBC: 7.2 10*3/uL (ref 4.0–10.5)

## 2014-04-22 LAB — BASIC METABOLIC PANEL
Anion gap: 13 (ref 5–15)
BUN: 17 mg/dL (ref 6–23)
CO2: 26 mEq/L (ref 19–32)
Calcium: 9.5 mg/dL (ref 8.4–10.5)
Chloride: 101 mEq/L (ref 96–112)
Creatinine, Ser: 0.8 mg/dL (ref 0.50–1.10)
GFR calc Af Amer: >90 mL/min (ref >90)
GFR, EST NON AFRICAN AMERICAN: 80 mL/min — AB (ref >90)
Glucose, Bld: 153 mg/dL — ABNORMAL HIGH (ref 70–99)
POTASSIUM: 4.3 meq/L (ref 3.7–5.3)
SODIUM: 140 meq/L (ref 137–147)

## 2014-04-22 LAB — TROPONIN I

## 2014-04-22 LAB — PRO B NATRIURETIC PEPTIDE: Pro B Natriuretic peptide (BNP): 53.1 pg/mL (ref 0–125)

## 2014-04-22 MED ORDER — PREDNISONE 50 MG PO TABS
50.0000 mg | ORAL_TABLET | Freq: Every day | ORAL | Status: DC
Start: 2014-04-22 — End: 2018-06-08

## 2014-04-22 MED ORDER — ALBUTEROL SULFATE HFA 108 (90 BASE) MCG/ACT IN AERS
1.0000 | INHALATION_SPRAY | RESPIRATORY_TRACT | Status: DC | PRN
  Administered 2014-04-22: 2 via RESPIRATORY_TRACT
  Filled 2014-04-22: qty 6.7

## 2014-04-22 MED ORDER — METHYLPREDNISOLONE SODIUM SUCC 125 MG IJ SOLR
125.0000 mg | Freq: Once | INTRAMUSCULAR | Status: AC
  Administered 2014-04-22: 125 mg via INTRAVENOUS
  Filled 2014-04-22: qty 2

## 2014-04-22 MED ORDER — IPRATROPIUM-ALBUTEROL 0.5-2.5 (3) MG/3ML IN SOLN
3.0000 mL | Freq: Once | RESPIRATORY_TRACT | Status: AC
  Administered 2014-04-22: 3 mL via RESPIRATORY_TRACT
  Filled 2014-04-22: qty 3

## 2014-04-22 MED ORDER — ALBUTEROL SULFATE (2.5 MG/3ML) 0.083% IN NEBU
2.5000 mg | INHALATION_SOLUTION | Freq: Once | RESPIRATORY_TRACT | Status: AC
  Administered 2014-04-22: 2.5 mg via RESPIRATORY_TRACT
  Filled 2014-04-22: qty 3

## 2014-04-22 NOTE — ED Provider Notes (Signed)
CSN: [id number]     Arrival date & time 04/22/14  0053 History   First MD Initiated Contact with Patient 04/22/14 0058     Chief Complaint  Patient presents with  . Shortness of Breath     (Consider location/radiation/quality/duration/timing/severity/associated sxs/prior Treatment) HPI Patient presents by EMS for gradual onset shortness of breath. She says she's had cough without production. No fever chills. She denies any chest pain. Shortness of breath is worse when lying flat. She denies any lower sugary swelling or pain. Patient has a history of COPD. She says she's not using any nebulizers or inhalers at home. Past Medical History  Diagnosis Date  . COPD (chronic obstructive pulmonary disease)   . Hypertension   . Diabetes mellitus without complication    History reviewed. No pertinent past surgical history. No family history on file. History  Substance Use Topics  . Smoking status: Never Smoker   . Smokeless tobacco: Not on file  . Alcohol Use: Not on file   OB History   Grav Para Term Preterm Abortions TAB SAB Ect Mult Living                 Review of Systems  Constitutional: Negative for fever and chills.  HENT: Positive for congestion. Negative for sore throat.   Respiratory: Positive for cough, shortness of breath and wheezing.   Cardiovascular: Negative for chest pain, palpitations and leg swelling.  Gastrointestinal: Negative for nausea, vomiting and abdominal pain.  Musculoskeletal: Negative for back pain, neck pain and neck stiffness.  Skin: Negative for rash and wound.  Neurological: Negative for dizziness, weakness, light-headedness, numbness and headaches.  All other systems reviewed and are negative.     Allergies  Review of patient's allergies indicates no known allergies.  Home Medications   Prior to Admission medications   Not on File   BP 149/79  Pulse 81  Temp(Src) 97.8 F (36.6 C) (Oral)  SpO2 99% Physical Exam  Nursing note and  vitals reviewed. Constitutional: She is oriented to person, place, and time. She appears well-developed and well-nourished. No distress.  HENT:  Head: Normocephalic and atraumatic.  Mouth/Throat: Oropharynx is clear and moist. No oropharyngeal exudate.  Eyes: EOM are normal. Pupils are equal, round, and reactive to light.  Neck: Normal range of motion. Neck supple.  Cardiovascular: Normal rate and regular rhythm.   Pulmonary/Chest: Effort normal. No respiratory distress. She has wheezes (Inspiratory and expiratory wheezes in all fields.). She has no rales. She exhibits no tenderness.  Abdominal: Soft. Bowel sounds are normal. She exhibits no distension and no mass. There is no tenderness. There is no rebound and no guarding.  Musculoskeletal: Normal range of motion. She exhibits no edema and no tenderness.  No calf swelling or tenderness.  Neurological: She is alert and oriented to person, place, and time.  Moves all extremities without deficit. Sensation is intact.  Skin: Skin is warm and dry. No rash noted. No erythema.  Psychiatric: She has a normal mood and affect. Her behavior is normal.    ED Course  Procedures (including critical care time) Labs Review Labs Reviewed  CBC WITH DIFFERENTIAL  BASIC METABOLIC PANEL  PRO B NATRIURETIC PEPTIDE  TROPONIN I    Imaging Review No results found.   EKG Interpretation None      Date: 04/22/2014  Rate: 74  Rhythm: normal sinus rhythm  QRS Axis: normal  Intervals: normal  ST/T Wave abnormalities: normal  Conduction Disutrbances:none  Narrative Interpretation:   Old  EKG Reviewed: none available   MDM   Final diagnoses:  None      Patient states she is feeling much better. Wheezing has resolved. No crackles or physical evidence of pulmonary edema. BNP was normal. Will give inhaler in the emergency department and showed poor steroids. Patient been advised to followup with her primary doctor. She's been given return  precautions and has voice understanding.  Loren Raceravid Corderius Saraceni, MD 04/22/14 [phone]

## 2014-04-22 NOTE — ED Notes (Signed)
Per EMS pt c/o SOB with nasal congestion with a cough, no distress

## 2014-04-22 NOTE — Patient Instructions (Signed)
Instructed patient on the proper use of administering albuterol mdi via aerochamber patient tolerated well 

## 2014-04-22 NOTE — Discharge Instructions (Signed)

## 2014-10-18 ENCOUNTER — Other Ambulatory Visit: Payer: Self-pay | Admitting: Neurosurgery

## 2014-10-18 DIAGNOSIS — M48061 Spinal stenosis, lumbar region without neurogenic claudication: Secondary | ICD-10-CM

## 2014-10-26 ENCOUNTER — Ambulatory Visit
Admission: RE | Admit: 2014-10-26 | Discharge: 2014-10-26 | Disposition: A | Discharge status: Home / Self Care | Payer: Medicare Other | Source: Ambulatory Visit | Attending: Neurosurgery | Admitting: Neurosurgery

## 2014-10-26 DIAGNOSIS — M48061 Spinal stenosis, lumbar region without neurogenic claudication: Secondary | ICD-10-CM

## 2014-10-26 DIAGNOSIS — M4316 Spondylolisthesis, lumbar region: Secondary | ICD-10-CM

## 2014-10-26 MED ORDER — IOHEXOL 180 MG/ML  SOLN
15.0000 mL | Freq: Once | INTRAMUSCULAR | Status: AC | PRN
  Administered 2014-10-26: 15 mL via INTRATHECAL

## 2014-10-26 MED ORDER — MEPERIDINE HCL 100 MG/ML IJ SOLN
100.0000 mg | Freq: Once | INTRAMUSCULAR | Status: AC
  Administered 2014-10-26: 100 mg via INTRAMUSCULAR

## 2014-10-26 MED ORDER — ONDANSETRON HCL 4 MG/2ML IJ SOLN: 4.0000 mg | Freq: Four times a day (QID) | INTRAMUSCULAR | Status: DC | PRN

## 2014-10-26 MED ORDER — ONDANSETRON HCL 4 MG/2ML IJ SOLN
4.0000 mg | Freq: Once | INTRAMUSCULAR | Status: AC
  Administered 2014-10-26: 4 mg via INTRAMUSCULAR

## 2014-10-26 MED ORDER — DIAZEPAM 5 MG PO TABS
10.0000 mg | ORAL_TABLET | Freq: Once | ORAL | Status: AC
  Administered 2014-10-26: 10 mg via ORAL

## 2014-10-26 NOTE — Progress Notes (Signed)
Patient states she has been off Citalopram/Celexa for at least the past two days.  jkl

## 2014-10-26 NOTE — Discharge Instructions (Signed)
[facility] Discharge Instructions  1. Go home and rest quietly for the next 24 hours.  It is important to lie flat for the next 24 hours.  Get up only to go to the restroom.  You may lie in the bed or on a couch on your back, your stomach, your left side or your right side.  You may have one pillow under your head.  You may have pillows between your knees while you are on your side or under your knees while you are on your back.  2. DO NOT drive today.  Recline the seat as far back as it will go, while still wearing your seat belt, on the way home.  3. You may get up to go to the bathroom as needed.  You may sit up for 10 minutes to eat.  You may resume your normal diet and medications unless otherwise indicated.  Drink plenty of extra fluids today and tomorrow.  4. The incidence of a spinal headache with nausea and/or vomiting is about 5% (one in 20 patients).  If you develop a headache, lie flat and drink plenty of fluids until the headache goes away.  Caffeinated beverages may be helpful.  If you develop severe nausea and vomiting or a headache that does not go away with flat bed rest, call [phone].  5. You may resume normal activities after your 24 hours of bed rest is over; however, do not exert yourself strongly or do any heavy lifting tomorrow.  6. Call your physician for a follow-up appointment.   You may resume Celexa/Citalopram on Thursday, October 27, 2014 at 1:00pm.

## 2014-11-24 ENCOUNTER — Encounter (HOSPITAL_BASED_OUTPATIENT_CLINIC_OR_DEPARTMENT_OTHER): Payer: Self-pay | Admitting: *Deleted

## 2014-11-24 ENCOUNTER — Emergency Department (HOSPITAL_BASED_OUTPATIENT_CLINIC_OR_DEPARTMENT_OTHER)
Admission: EM | Admit: 2014-11-24 | Discharge: 2014-11-24 | Disposition: A | Discharge status: Home / Self Care | Payer: Medicare Other | Attending: Emergency Medicine | Admitting: Emergency Medicine

## 2014-11-24 DIAGNOSIS — Z8669 Personal history of other diseases of the nervous system and sense organs: Secondary | ICD-10-CM | POA: Insufficient documentation

## 2014-11-24 DIAGNOSIS — Z79899 Other long term (current) drug therapy: Secondary | ICD-10-CM | POA: Insufficient documentation

## 2014-11-24 DIAGNOSIS — M549 Dorsalgia, unspecified: Secondary | ICD-10-CM | POA: Diagnosis present

## 2014-11-24 DIAGNOSIS — E119 Type 2 diabetes mellitus without complications: Secondary | ICD-10-CM | POA: Diagnosis not present

## 2014-11-24 DIAGNOSIS — I10 Essential (primary) hypertension: Secondary | ICD-10-CM | POA: Insufficient documentation

## 2014-11-24 DIAGNOSIS — M5432 Sciatica, left side: Secondary | ICD-10-CM

## 2014-11-24 DIAGNOSIS — J449 Chronic obstructive pulmonary disease, unspecified: Secondary | ICD-10-CM | POA: Insufficient documentation

## 2014-11-24 DIAGNOSIS — M5442 Lumbago with sciatica, left side: Secondary | ICD-10-CM | POA: Insufficient documentation

## 2014-11-24 DIAGNOSIS — F319 Bipolar disorder, unspecified: Secondary | ICD-10-CM | POA: Insufficient documentation

## 2014-11-24 MED ORDER — OXYCODONE HCL 10 MG PO TABS
10.0000 mg | ORAL_TABLET | ORAL | Status: DC | PRN
Start: 2014-11-24 — End: 2018-06-08

## 2014-11-24 NOTE — Discharge Instructions (Signed)
Sciatica Sciatica is pain, weakness, numbness, or tingling along the path of the sciatic nerve. The nerve starts in the lower back and runs down the back of each leg. The nerve controls the muscles in the lower leg and in the back of the knee, while also providing sensation to the back of the thigh, lower leg, and the sole of your foot. Sciatica is a symptom of another medical condition. For instance, nerve damage or certain conditions, such as a herniated disk or bone spur on the spine, pinch or put pressure on the sciatic nerve. This causes the pain, weakness, or other sensations normally associated with sciatica. Generally, sciatica only affects one side of the body. CAUSES   Herniated or slipped disc.  Degenerative disk disease.  A pain disorder involving the narrow muscle in the buttocks (piriformis syndrome).  Pelvic injury or fracture.  Pregnancy.  Tumor (rare). SYMPTOMS  Symptoms can vary from mild to very severe. The symptoms usually travel from the low back to the buttocks and down the back of the leg. Symptoms can include:  Mild tingling or dull aches in the lower back, leg, or hip.  Numbness in the back of the calf or sole of the foot.  Burning sensations in the lower back, leg, or hip.  Sharp pains in the lower back, leg, or hip.  Leg weakness.  Severe back pain inhibiting movement. These symptoms may get worse with coughing, sneezing, laughing, or prolonged sitting or standing. Also, being overweight may worsen symptoms. DIAGNOSIS  Your caregiver will perform a physical exam to look for common symptoms of sciatica. He or she may ask you to do certain movements or activities that would trigger sciatic nerve pain. Other tests may be performed to find the cause of the sciatica. These may include:  Blood tests.  X-rays.  Imaging tests, such as an MRI or CT scan. TREATMENT  Treatment is directed at the cause of the sciatic pain. Sometimes, treatment is not necessary  and the pain and discomfort goes away on its own. If treatment is needed, your caregiver may suggest:  Over-the-counter medicines to relieve pain.  Prescription medicines, such as anti-inflammatory medicine, muscle relaxants, or narcotics.  Applying heat or ice to the painful area.  Steroid injections to lessen pain, irritation, and inflammation around the nerve.  Reducing activity during periods of pain.  Exercising and stretching to strengthen your abdomen and improve flexibility of your spine. Your caregiver may suggest losing weight if the extra weight makes the back pain worse.  Physical therapy.  Surgery to eliminate what is pressing or pinching the nerve, such as a bone spur or part of a herniated disk. HOME CARE INSTRUCTIONS   Only take over-the-counter or prescription medicines for pain or discomfort as directed by your caregiver.  Apply ice to the affected area for 20 minutes, 3-4 times a day for the first 48-72 hours. Then try heat in the same way.  Exercise, stretch, or perform your usual activities if these do not aggravate your pain.  Attend physical therapy sessions as directed by your caregiver.  Keep all follow-up appointments as directed by your caregiver.  Do not wear high heels or shoes that do not provide proper support.  Check your mattress to see if it is too soft. A firm mattress may lessen your pain and discomfort. SEEK IMMEDIATE MEDICAL CARE IF:   You lose control of your bowel or bladder (incontinence).  You have increasing weakness in the lower back, pelvis, buttocks,   or legs.  You have redness or swelling of your back.  You have a burning sensation when you urinate.  You have pain that gets worse when you lie down or awakens you at night.  Your pain is worse than you have experienced in the past.  Your pain is lasting longer than 4 weeks.  You are suddenly losing weight without reason. MAKE SURE YOU:  Understand these  instructions.  Will watch your condition.  Will get help right away if you are not doing well or get worse. Document Released: 08/13/2001 Document Revised: 02/18/2012 Document Reviewed: 12/29/2011 [employer] Patient Information 2015 [employer]. This information is not intended to replace advice given to you by your health care provider. Make sure you discuss any questions you have with your health care provider.  

## 2014-11-24 NOTE — ED Notes (Addendum)
Lower back pain on and off for 3 years. She ran out of pain medication 2 weeks ago and needs a refil. She drove herself here.

## 2014-11-24 NOTE — ED Provider Notes (Signed)
CSN: [id number]     Arrival date & time 11/24/14  2041 History  This chart was scribed for Gilda Creasehristopher J Pollina, MD by Roxy Cedarhandni Bhalodia, ED Scribe. This patient was seen in room MH02/MH02 and the patient's care was started at 9:53 PM.   Chief Complaint  Patient presents with  . Back Pain   Patient is a 59 y.o. female presenting with back pain. The history is provided by the patient. No language interpreter was used.  Back Pain Location:  Sacro-iliac joint and lumbar spine Quality:  Aching Radiates to:  L posterior upper leg Pain severity:  Moderate Onset quality:  Gradual Timing:  Constant Progression:  Waxing and waning Chronicity:  Chronic Relieved by:  Narcotics Worsened by:  Nothing tried Ineffective treatments:  Narcotics  HPI Comments: Donna Robinsonsriscilla Chase is a 59 y.o. female with a PMHx of bipolar disorder, arthritis, diabetes, COPD and hypertension, who presents to the Emergency Department complaining of moderate left sided back pain that radiates down left leg. Patient reports recent onset of chronic back pain and states that her PCP has recently referred her to be seen by a pain clinic. She states that she has currently run out of Roxicodone prescribed by her PCP which has caused onset of lower back pain.  Past Medical History  Diagnosis Date  . Bipolar 1 disorder   . Arthritis   . Sleep apnea   . Diabetes mellitus without complication     fasting 80-90  . History of panic attacks     takes depakote for this  . COPD (chronic obstructive pulmonary disease)   . Hypertension   . Diabetes mellitus without complication    Past Surgical History  Procedure Laterality Date  . Joint replacement Left     hip    No family history on file. History  Substance Use Topics  . Smoking status: Never Smoker   . Smokeless tobacco: Not on file  . Alcohol Use: No   OB History    No data available     Review of Systems  Musculoskeletal: Positive for back pain.  All other  systems reviewed and are negative.  Allergies  Review of patient's allergies indicates no known allergies.  Home Medications   Prior to Admission medications   Medication Sig Start Date End Date Taking? Authorizing Provider  citalopram (CELEXA) 40 MG tablet Take 40 mg by mouth daily.    Historical Provider, MD  diazepam (VALIUM) 5 MG tablet Take 1 tablet (5 mg total) by mouth every 6 (six) hours as needed for muscle spasms. 01/21/14   Tressie StalkerJeffrey Jenkins, MD  diazepam (VALIUM) 5 MG tablet Take one tablet by mouth every 6 hours as needed for muscle spasms 01/21/14   Monina C Medina-Vargas, NP  divalproex (DEPAKOTE) 500 MG DR tablet Take 500 mg by mouth 3 (three) times daily.    Historical Provider, MD  docusate sodium 100 MG CAPS Take 100 mg by mouth 2 (two) times daily. 01/21/14   Tressie StalkerJeffrey Jenkins, MD  glyBURIDE (DIABETA) 2.5 MG tablet Take 2.5 mg by mouth daily with breakfast.    Historical Provider, MD  lisinopril (PRINIVIL,ZESTRIL) 40 MG tablet Take 40 mg by mouth daily.    Historical Provider, MD  Oxycodone HCl 10 MG TABS Take 1 tablet (10 mg total) by mouth every 4 (four) hours as needed. 11/24/14   Gilda Creasehristopher J Pollina, MD  predniSONE (DELTASONE) 20 MG tablet Take 20 mg by mouth 2 (two) times daily. 12/06/12   Mancel BaleElliott Wentz,  MD  predniSONE (DELTASONE) 50 MG tablet Take 1 tablet (50 mg total) by mouth daily. 04/22/14   Loren Racer, MD   Triage Vitals: BP 150/73 mmHg  Pulse 74  Temp(Src) 98.3 F (36.8 C) (Oral)  Resp 20  Ht  (1.676 m)  Wt 260 lb (117.935 kg)  BMI 41.99 kg/m2  SpO2 95%  Physical Exam  Constitutional: She is oriented to person, place, and time. She appears well-developed and well-nourished. No distress.  HENT:  Head: Normocephalic and atraumatic.  Right Ear: Hearing normal.  Left Ear: Hearing normal.  Nose: Nose normal.  Mouth/Throat: Oropharynx is clear and moist and mucous membranes are normal.  Eyes: Conjunctivae and EOM are normal. Pupils are equal, round,  and reactive to light.  Neck: Normal range of motion. Neck supple.  Cardiovascular: Regular rhythm, S1 normal and S2 normal.  Exam reveals no gallop and no friction rub.   No murmur heard. Pulmonary/Chest: Effort normal and breath sounds normal. No respiratory distress. She exhibits no tenderness.  Abdominal: Soft. Normal appearance and bowel sounds are normal. There is no hepatosplenomegaly. There is no tenderness. There is no rebound, no guarding, no tenderness at McBurney's point and negative Murphy's sign. No hernia.  Musculoskeletal: Normal range of motion. She exhibits tenderness.  Diffuse lower back tenderness.  Neurological: She is alert and oriented to person, place, and time. She has normal strength. No cranial nerve deficit or sensory deficit. Coordination normal. GCS eye subscore is 4. GCS verbal subscore is 5. GCS motor subscore is 6.  Skin: Skin is warm, dry and intact. No rash noted. No cyanosis.  Psychiatric: She has a normal mood and affect. Her speech is normal and behavior is normal. Thought content normal.  Nursing note and vitals reviewed.  ED Course  Procedures (including critical care time)  DIAGNOSTIC STUDIES: Oxygen Saturation is 95% on RA, normal by my interpretation.    COORDINATION OF CARE: 10:03 PM- Will give patient oxycodone HCl 10 mg tablets. Pt advised of plan for treatment and pt agrees.  Labs Review Labs Reviewed - No data to display  Imaging Review No results found.   EKG Interpretation None     MDM   Final diagnoses:  Sciatica, left   Patient presents to the ER with musculoskeletal back pain. Examination reveals back tenderness without any associated neurologic findings. Patient's strength, sensation and reflexes were normal. As such, patient did not require any imaging or further studies. Patient was treated with analgesia.  I personally performed the services described in this documentation, which was scribed in my presence. The recorded  information has been reviewed and is accurate.    Gilda Crease, MD 11/24/14 2209

## 2014-12-13 DIAGNOSIS — M961 Postlaminectomy syndrome, not elsewhere classified: Secondary | ICD-10-CM | POA: Insufficient documentation

## 2015-01-08 ENCOUNTER — Encounter (HOSPITAL_BASED_OUTPATIENT_CLINIC_OR_DEPARTMENT_OTHER): Payer: Self-pay | Admitting: Emergency Medicine

## 2015-01-08 DIAGNOSIS — B349 Viral infection, unspecified: Secondary | ICD-10-CM | POA: Insufficient documentation

## 2015-01-08 DIAGNOSIS — J449 Chronic obstructive pulmonary disease, unspecified: Secondary | ICD-10-CM | POA: Insufficient documentation

## 2015-01-08 DIAGNOSIS — F319 Bipolar disorder, unspecified: Secondary | ICD-10-CM | POA: Insufficient documentation

## 2015-01-08 DIAGNOSIS — M1990 Unspecified osteoarthritis, unspecified site: Secondary | ICD-10-CM | POA: Insufficient documentation

## 2015-01-08 DIAGNOSIS — E119 Type 2 diabetes mellitus without complications: Secondary | ICD-10-CM | POA: Insufficient documentation

## 2015-01-08 DIAGNOSIS — F410 Panic disorder [episodic paroxysmal anxiety] without agoraphobia: Secondary | ICD-10-CM | POA: Insufficient documentation

## 2015-01-08 DIAGNOSIS — R05 Cough: Secondary | ICD-10-CM | POA: Diagnosis present

## 2015-01-08 DIAGNOSIS — Z8669 Personal history of other diseases of the nervous system and sense organs: Secondary | ICD-10-CM | POA: Diagnosis not present

## 2015-01-08 DIAGNOSIS — Z7952 Long term (current) use of systemic steroids: Secondary | ICD-10-CM | POA: Diagnosis not present

## 2015-01-08 DIAGNOSIS — Z79899 Other long term (current) drug therapy: Secondary | ICD-10-CM | POA: Insufficient documentation

## 2015-01-08 DIAGNOSIS — I10 Essential (primary) hypertension: Secondary | ICD-10-CM | POA: Diagnosis not present

## 2015-01-08 NOTE — ED Notes (Signed)
Pt states she has had a cold and sore throat for about 3 weeks now, has seen a dr but can't get rid of it

## 2015-01-09 ENCOUNTER — Encounter (HOSPITAL_BASED_OUTPATIENT_CLINIC_OR_DEPARTMENT_OTHER): Payer: Self-pay | Admitting: Emergency Medicine

## 2015-01-09 ENCOUNTER — Emergency Department (HOSPITAL_BASED_OUTPATIENT_CLINIC_OR_DEPARTMENT_OTHER): Payer: Medicare Other

## 2015-01-09 ENCOUNTER — Emergency Department (HOSPITAL_BASED_OUTPATIENT_CLINIC_OR_DEPARTMENT_OTHER)
Admission: EM | Admit: 2015-01-09 | Discharge: 2015-01-09 | Disposition: A | Discharge status: Home / Self Care | Payer: Medicare Other | Attending: Emergency Medicine | Admitting: Emergency Medicine

## 2015-01-09 DIAGNOSIS — B349 Viral infection, unspecified: Secondary | ICD-10-CM

## 2015-01-09 LAB — RAPID STREP SCREEN (MED CTR MEBANE ONLY): STREPTOCOCCUS, GROUP A SCREEN (DIRECT): NEGATIVE

## 2015-01-09 MED ORDER — LIDOCAINE VISCOUS 2 % MT SOLN
OROMUCOSAL | Status: AC
  Administered 2015-01-09: 15 mL via OROMUCOSAL
  Filled 2015-01-09: qty 15

## 2015-01-09 MED ORDER — CETIRIZINE HCL 10 MG PO TABS: 10.0000 mg | ORAL_TABLET | Freq: Every day | ORAL | Status: DC

## 2015-01-09 MED ORDER — NAPROXEN 250 MG PO TABS
500.0000 mg | ORAL_TABLET | Freq: Once | ORAL | Status: AC
  Administered 2015-01-09: 500 mg via ORAL
  Filled 2015-01-09: qty 2

## 2015-01-09 MED ORDER — LORATADINE 10 MG PO TABS
10.0000 mg | ORAL_TABLET | Freq: Once | ORAL | Status: AC
  Administered 2015-01-09: 10 mg via ORAL
  Filled 2015-01-09: qty 1

## 2015-01-09 MED ORDER — FLUTICASONE PROPIONATE 50 MCG/ACT NA SUSP: 2.0000 | Freq: Every day | NASAL | Status: DC

## 2015-01-09 MED ORDER — BENZONATATE 100 MG PO CAPS
200.0000 mg | ORAL_CAPSULE | Freq: Once | ORAL | Status: AC
  Administered 2015-01-09: 200 mg via ORAL
  Filled 2015-01-09: qty 2

## 2015-01-09 MED ORDER — MAGIC MOUTHWASH: 5.0000 mL | Freq: Three times a day (TID) | ORAL | Status: DC | PRN

## 2015-01-09 MED ORDER — BENZONATATE 100 MG PO CAPS: 100.0000 mg | ORAL_CAPSULE | Freq: Three times a day (TID) | ORAL | Status: DC

## 2015-01-09 MED ORDER — LIDOCAINE VISCOUS 2 % MT SOLN
15.0000 mL | Freq: Once | OROMUCOSAL | Status: AC
  Administered 2015-01-09: 15 mL via OROMUCOSAL

## 2015-01-09 NOTE — ED Notes (Signed)
Pt c/o sore throat, hurts to swallow, pain constant, ongoing for 3d, no dyspnea noted.

## 2015-01-09 NOTE — ED Notes (Signed)
Dr. Palumbo at [facility] 

## 2015-01-09 NOTE — Progress Notes (Signed)
[facility] what ingrdients to place in Magic Mouthwash prescribed for patient.  EDCM discussed patient with EDP Kohut. No known allergies per patient chart.  Magic mouthwash to include 120cc's Maalox extra strength , 60 cc's nystantin, 300 cc's benadryl with 40 cc's 2% Lidocaine.  EDCM called prescription into [facility] at [facility].  No further EDCM needs at this time.

## 2015-01-09 NOTE — ED Provider Notes (Signed)
CSN: [id number]     Arrival date & time 01/08/15  2349 History   First MD Initiated Contact with Patient 01/09/15 0241     Chief Complaint  Patient presents with  . URI     (Consider location/radiation/quality/duration/timing/severity/associated sxs/prior Treatment) Patient is a 59 y.o. female presenting with URI. The history is provided by the patient.  URI Presenting symptoms: congestion, cough and sore throat   Presenting symptoms: no ear pain and no fever   Severity:  Mild Onset quality:  Gradual Timing:  Intermittent Progression:  Unchanged Chronicity:  New Relieved by:  Nothing Worsened by:  Nothing tried Ineffective treatments:  None tried Associated symptoms: no arthralgias, no neck pain and no swollen glands   Risk factors: no immunosuppression     Past Medical History  Diagnosis Date  . Bipolar 1 disorder   . Arthritis   . Sleep apnea   . Diabetes mellitus without complication     fasting 80-90  . History of panic attacks     takes depakote for this  . COPD (chronic obstructive pulmonary disease)   . Hypertension   . Diabetes mellitus without complication    Past Surgical History  Procedure Laterality Date  . Joint replacement Left     hip    History reviewed. No pertinent family history. History  Substance Use Topics  . Smoking status: Never Smoker   . Smokeless tobacco: Not on file  . Alcohol Use: No   OB History    No data available     Review of Systems  Constitutional: Negative for fever.  HENT: Positive for congestion and sore throat. Negative for drooling, ear pain, facial swelling, trouble swallowing and voice change.   Respiratory: Positive for cough. Negative for shortness of breath.   Musculoskeletal: Negative for arthralgias and neck pain.  All other systems reviewed and are negative.     Allergies  Review of patient's allergies indicates no known allergies.  Home Medications   Prior to Admission medications   Medication Sig  Start Date End Date Taking? Authorizing Provider  citalopram (CELEXA) 40 MG tablet Take 40 mg by mouth daily.    Historical Provider, MD  diazepam (VALIUM) 5 MG tablet Take 1 tablet (5 mg total) by mouth every 6 (six) hours as needed for muscle spasms. 01/21/14   Tressie StalkerJeffrey Jenkins, MD  diazepam (VALIUM) 5 MG tablet Take one tablet by mouth every 6 hours as needed for muscle spasms 01/21/14   Monina C Medina-Vargas, NP  divalproex (DEPAKOTE) 500 MG DR tablet Take 500 mg by mouth 3 (three) times daily.    Historical Provider, MD  docusate sodium 100 MG CAPS Take 100 mg by mouth 2 (two) times daily. 01/21/14   Tressie StalkerJeffrey Jenkins, MD  glyBURIDE (DIABETA) 2.5 MG tablet Take 2.5 mg by mouth daily with breakfast.    Historical Provider, MD  lisinopril (PRINIVIL,ZESTRIL) 40 MG tablet Take 40 mg by mouth daily.    Historical Provider, MD  Oxycodone HCl 10 MG TABS Take 1 tablet (10 mg total) by mouth every 4 (four) hours as needed. 11/24/14   Gilda Creasehristopher J Pollina, MD  predniSONE (DELTASONE) 20 MG tablet Take 20 mg by mouth 2 (two) times daily. 12/06/12   Mancel BaleElliott Wentz, MD  predniSONE (DELTASONE) 50 MG tablet Take 1 tablet (50 mg total) by mouth daily. 04/22/14   Loren Raceravid Yelverton, MD   BP 172/84 mmHg  Pulse 79  Temp(Src) 98.8 F (37.1 C) (Oral)  Resp 18  Ht 5'  5" (1.651 m)  Wt 260 lb (117.935 kg)  BMI 43.27 kg/m2  SpO2 97% Physical Exam  Constitutional: She is oriented to person, place, and time. She appears well-developed and well-nourished. No distress.  HENT:  Head: Normocephalic and atraumatic.  Mouth/Throat: Oropharynx is clear and moist.  Eyes: Conjunctivae are normal. Pupils are equal, round, and reactive to light.  Neck: Normal range of motion. Neck supple.  Cardiovascular: Normal rate, regular rhythm and intact distal pulses.   Pulmonary/Chest: Effort normal and breath sounds normal. No stridor. No respiratory distress. She has no wheezes. She has no rales.  Abdominal: Soft. Bowel sounds are normal.  There is no tenderness. There is no rebound and no guarding.  Musculoskeletal: Normal range of motion.  Lymphadenopathy:    She has no cervical adenopathy.  Neurological: She is alert and oriented to person, place, and time.  Skin: Skin is warm and dry.  Psychiatric: She has a normal mood and affect.    ED Course  Procedures (including critical care time) Labs Review Labs Reviewed  RAPID STREP SCREEN  CULTURE, GROUP A STREP    Imaging Review Dg Chest 2 View  01/09/2015   CLINICAL DATA:  Cold and sore throat for 3 weeks.  EXAM: CHEST  2 VIEW  COMPARISON:  04/22/2014  FINDINGS: Normal heart size and pulmonary vascularity. No focal airspace disease or consolidation in the lungs. Bronchial wall thickening particularly in the right lung base probably represents changes of bronchitis or reactive disease. No blunting of costophrenic angles. No pneumothorax. Mediastinal contours appear intact.  IMPRESSION: Bronchial wall thickening suggesting bronchitis or reactive disease. No focal consolidation or airspace disease in the lungs.   Electronically Signed   By: Burman NievesWilliam  Stevens M.D.   On: 01/09/2015 01:02     EKG Interpretation None      MDM   Final diagnoses:  None    URI: negative strep and cxr will treat symptoms.  Close follow up with PMD    Redell Nazir, MD 01/09/15 [id number]

## 2015-01-11 LAB — CULTURE, GROUP A STREP: Strep A Culture: NEGATIVE

## 2015-06-04 IMAGING — DX DG CHEST 2V
2 series · 2 of 2 positions shown · non-contrast
Comparison: 04/22/2014

CLINICAL DATA: Cold and sore throat for 3 weeks.

EXAM:
CHEST  2 VIEW

[chest pa]
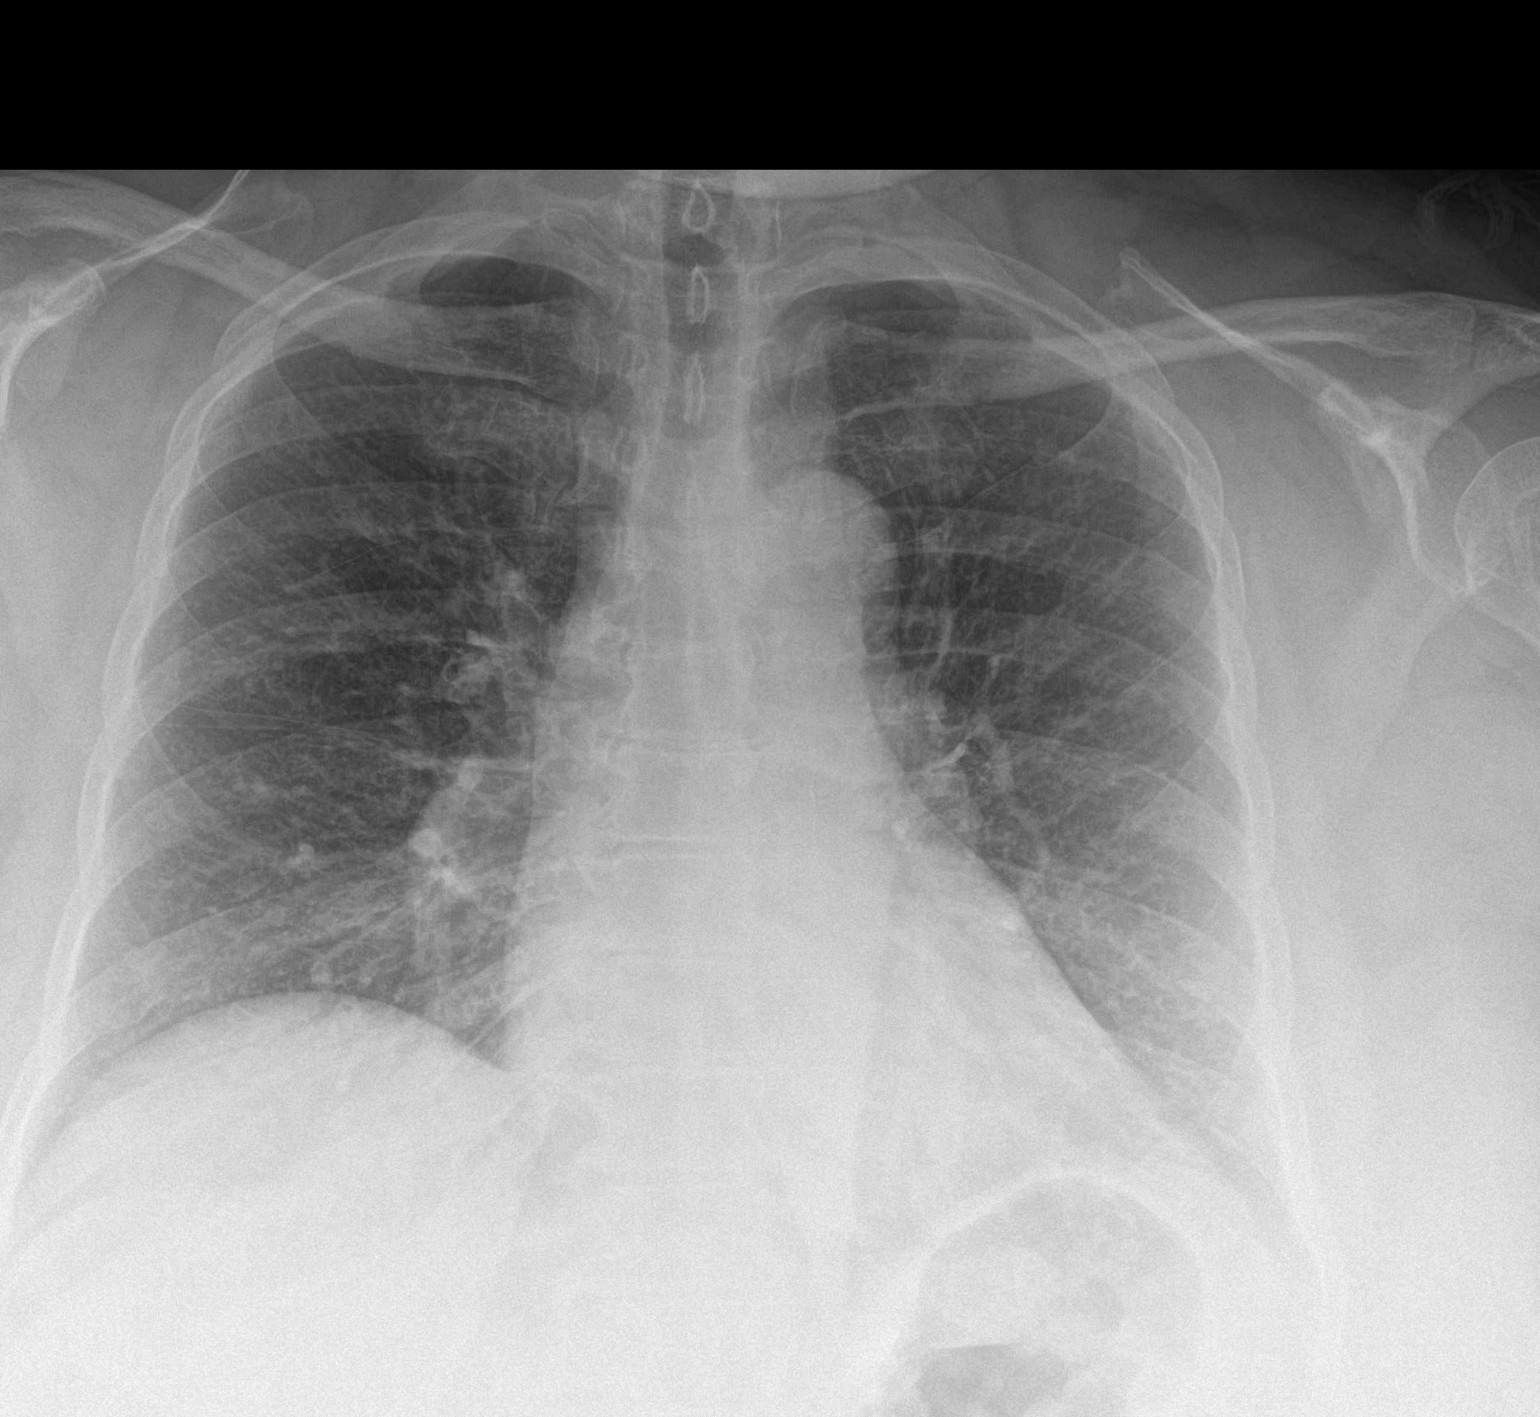

[chest lat]
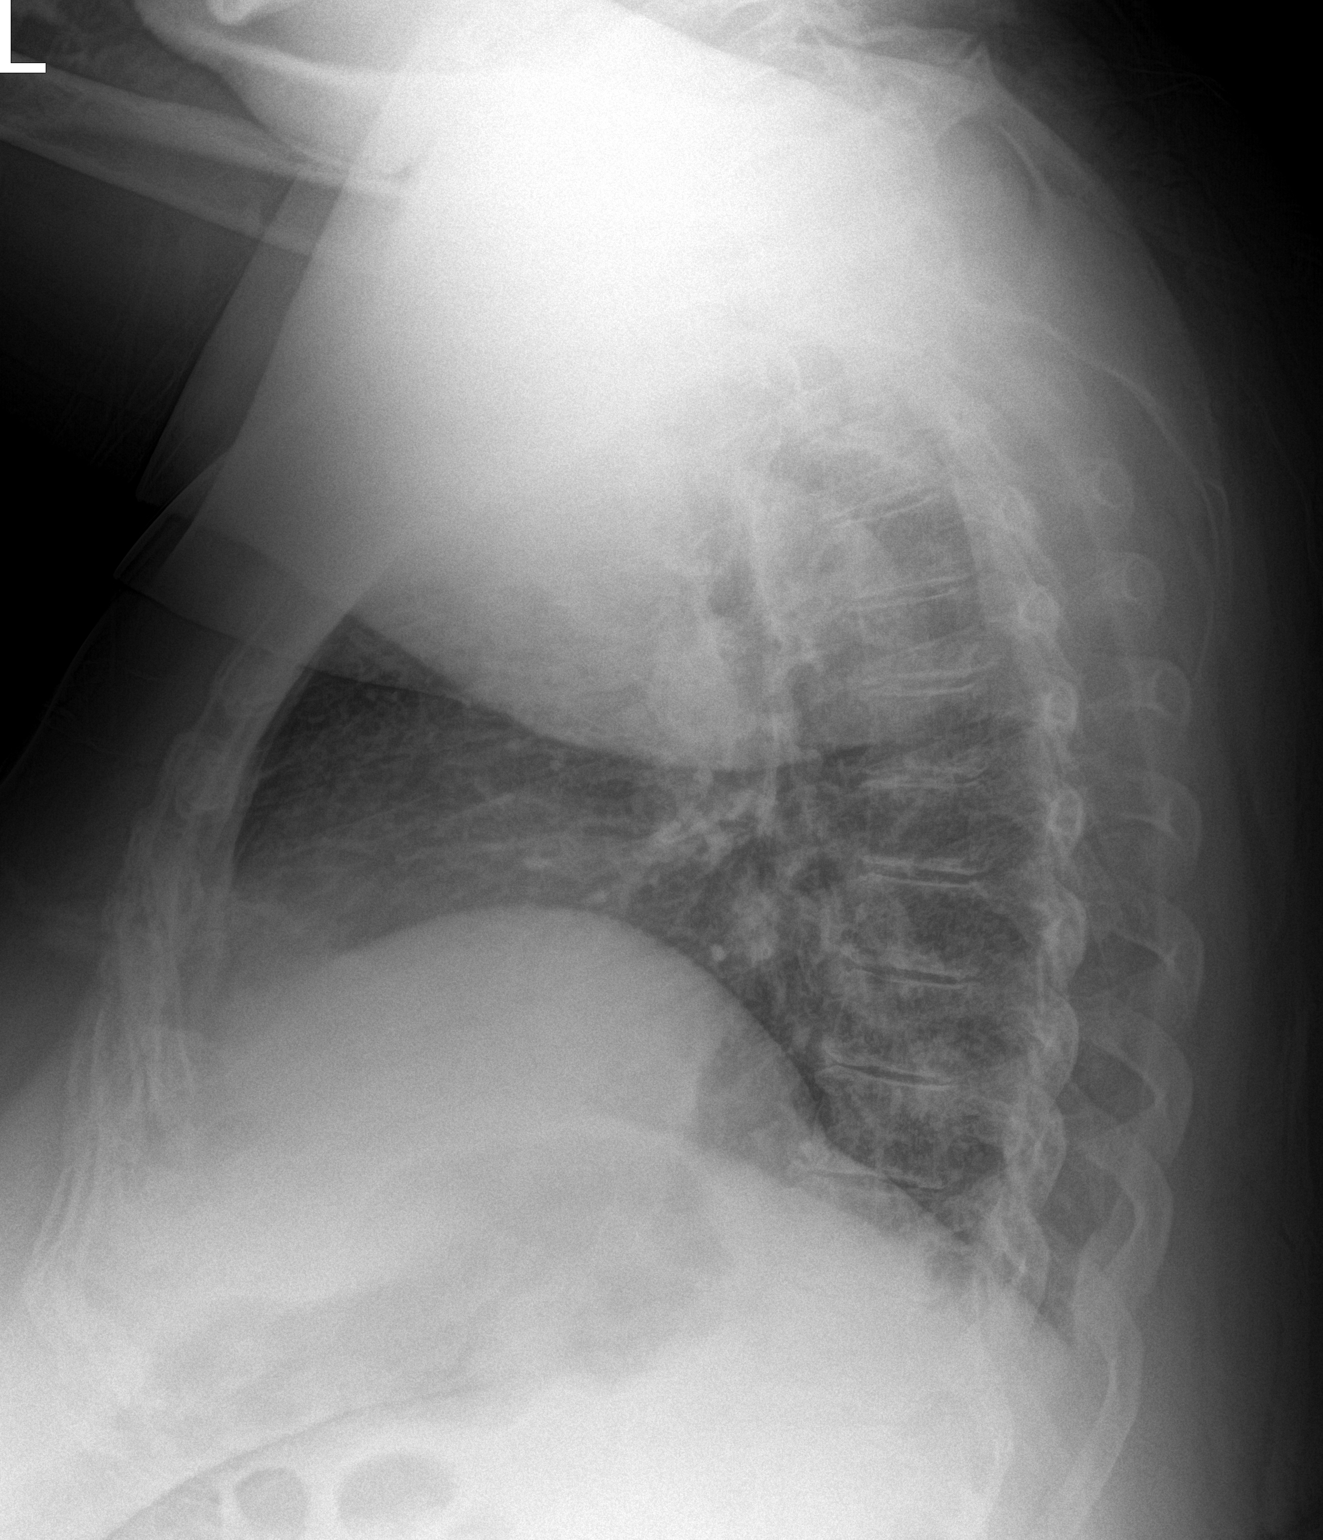

[2 of 2 positions shown; findings below may reference images not displayed]

FINDINGS: Normal heart size and pulmonary vascularity. No focal airspace
disease or consolidation in the lungs. Bronchial wall thickening
particularly in the right lung base probably represents changes of
bronchitis or reactive disease. No blunting of costophrenic angles.
No pneumothorax. Mediastinal contours appear intact.
IMPRESSION: Bronchial wall thickening suggesting bronchitis or reactive disease.
No focal consolidation or airspace disease in the lungs.

## 2015-06-07 DIAGNOSIS — F17200 Nicotine dependence, unspecified, uncomplicated: Secondary | ICD-10-CM | POA: Insufficient documentation

## 2016-01-25 DIAGNOSIS — G5602 Carpal tunnel syndrome, left upper limb: Secondary | ICD-10-CM | POA: Insufficient documentation

## 2016-06-10 DIAGNOSIS — J449 Chronic obstructive pulmonary disease, unspecified: Secondary | ICD-10-CM | POA: Insufficient documentation

## 2017-04-22 DIAGNOSIS — M7918 Myalgia, other site: Secondary | ICD-10-CM | POA: Insufficient documentation

## 2017-04-22 DIAGNOSIS — M6250 Muscle wasting and atrophy, not elsewhere classified, unspecified site: Secondary | ICD-10-CM | POA: Insufficient documentation

## 2017-05-30 DIAGNOSIS — I10 Essential (primary) hypertension: Secondary | ICD-10-CM | POA: Insufficient documentation

## 2017-05-30 DIAGNOSIS — Z9889 Other specified postprocedural states: Secondary | ICD-10-CM | POA: Insufficient documentation

## 2017-05-30 DIAGNOSIS — Z96643 Presence of artificial hip joint, bilateral: Secondary | ICD-10-CM | POA: Insufficient documentation

## 2017-05-30 DIAGNOSIS — E785 Hyperlipidemia, unspecified: Secondary | ICD-10-CM | POA: Insufficient documentation

## 2017-07-30 DIAGNOSIS — K589 Irritable bowel syndrome without diarrhea: Secondary | ICD-10-CM | POA: Insufficient documentation

## 2017-12-04 DIAGNOSIS — M160 Bilateral primary osteoarthritis of hip: Secondary | ICD-10-CM | POA: Insufficient documentation

## 2018-05-18 DIAGNOSIS — M25551 Pain in right hip: Secondary | ICD-10-CM | POA: Insufficient documentation

## 2018-05-28 DIAGNOSIS — Z6841 Body Mass Index (BMI) 40.0 and over, adult: Secondary | ICD-10-CM | POA: Insufficient documentation

## 2018-06-08 ENCOUNTER — Ambulatory Visit (INDEPENDENT_AMBULATORY_CARE_PROVIDER_SITE_OTHER): Payer: Medicare PPO | Admitting: Family Medicine

## 2018-06-08 ENCOUNTER — Encounter: Payer: Self-pay | Admitting: Family Medicine

## 2018-06-08 VITALS — BP 137/82 | HR 92 | Temp 97.6°F | Ht 65.0 in | Wt 275.6 lb

## 2018-06-08 DIAGNOSIS — I10 Essential (primary) hypertension: Secondary | ICD-10-CM

## 2018-06-08 DIAGNOSIS — J449 Chronic obstructive pulmonary disease, unspecified: Secondary | ICD-10-CM

## 2018-06-08 DIAGNOSIS — E1140 Type 2 diabetes mellitus with diabetic neuropathy, unspecified: Secondary | ICD-10-CM

## 2018-06-08 DIAGNOSIS — Z6841 Body Mass Index (BMI) 40.0 and over, adult: Secondary | ICD-10-CM

## 2018-06-08 DIAGNOSIS — E1165 Type 2 diabetes mellitus with hyperglycemia: Secondary | ICD-10-CM | POA: Diagnosis not present

## 2018-06-08 DIAGNOSIS — M25551 Pain in right hip: Secondary | ICD-10-CM

## 2018-06-08 LAB — BAYER DCA HB A1C WAIVED: HB A1C (BAYER DCA - WAIVED): 6.7 % (ref <7.0)

## 2018-06-08 MED ORDER — LISINOPRIL 40 MG PO TABS: 40.0000 mg | ORAL_TABLET | Freq: Every day | ORAL | 0 refills | Status: AC

## 2018-06-08 MED ORDER — SITAGLIPTIN PHOSPHATE 100 MG PO TABS: 100.0000 mg | ORAL_TABLET | Freq: Every day | ORAL | 0 refills | Status: AC

## 2018-06-08 MED ORDER — MELOXICAM 15 MG PO TABS: ORAL_TABLET | ORAL | 0 refills | Status: AC

## 2018-06-08 MED ORDER — CITALOPRAM HYDROBROMIDE 40 MG PO TABS: 40.0000 mg | ORAL_TABLET | Freq: Every day | ORAL | 0 refills | Status: AC

## 2018-06-08 MED ORDER — ATORVASTATIN CALCIUM 20 MG PO TABS: 20.0000 mg | ORAL_TABLET | Freq: Every day | ORAL | 0 refills | Status: AC

## 2018-06-08 MED ORDER — OMEPRAZOLE 20 MG PO CPDR: DELAYED_RELEASE_CAPSULE | ORAL | 0 refills | Status: AC

## 2018-06-08 NOTE — Progress Notes (Signed)
Subjective:  Patient ID: Donna Chase, female    DOB: 06-Oct-1955  Age: 62 y.o. MRN: [id number]  CC: New Patient (Initial Visit)   HPI Donna Chase presents for new patient evaluation. Follow-up of diabetes. Patient checks blood sugar at home.   100- 120 fasting and not checking postprandial States it is under good control but doesn't remember her last A1c value.  Patient denies symptoms such as polyuria, polydipsia, excessive hunger, nausea No significant hypoglycemic spells noted. Medications reviewed. Pt reports taking them regularly without complication/adverse reaction being reported today.  Worried about weight. Can't seem to lose even avoiding fried foods. Has no other diet plan. Asks for weight loss med.  Pt. Has chronic low back pain. Had spondylolisthesis surgery - L4-5 decompression, instrumentaation &  fusion 4 years ago. (chart reviewed)  She has a lot of hip pain as well Ortho in [facility] is waiting for her to lose weight before he operates. Pt. Requests oxycodone refill and referral to a new orthopedist.  History Donna Chase has a past medical history of Arthritis, Bipolar 1 disorder ([place]), COPD (chronic obstructive pulmonary disease) ([place]), Diabetes mellitus without complication ([place]), Diabetes mellitus without complication ([place]), History of panic attacks, Hypertension, and Sleep apnea.   She has a past surgical history that includes Joint replacement (Left).   Her family history is not on file.She reports that she has been smoking cigarettes. She has been smoking about 0.50 packs per day. She has never used smokeless tobacco. She reports that she does not drink alcohol or use drugs.  Current Outpatient Medications on File Prior to Visit  Medication Sig Dispense Refill  . ACCU-CHEK SOFTCLIX LANCETS lancets Inject into the skin.    . Blood Glucose Monitoring Suppl (ONE TOUCH ULTRA 2) w/Device KIT     . divalproex (DEPAKOTE) 500 MG DR tablet Take by mouth.    Marland Kitchen  glucose blood (ONE TOUCH ULTRA TEST) test strip     . naloxone (NARCAN) nasal spray 4 mg/0.1 mL 4 mg/0.1 mL. Administer a single spray intranasally into one nostril.  Call 911.  May repeat x1 in 2 to 3 minutes using a new nasal spray.    . Oxycodone HCl 20 MG TABS Take by mouth.     No current facility-administered medications on file prior to visit.     ROS Review of Systems  Constitutional: Negative.   HENT: Negative for congestion.   Eyes: Negative for visual disturbance.  Respiratory: Negative for shortness of breath.   Cardiovascular: Negative for chest pain.  Gastrointestinal: Negative for abdominal pain, constipation, diarrhea, nausea and vomiting.  Genitourinary: Negative for difficulty urinating.  Musculoskeletal: Positive for arthralgias, back pain, joint swelling and myalgias.  Neurological: Negative for headaches.  Psychiatric/Behavioral: Negative for sleep disturbance.    Objective:  BP 137/82   Pulse 92   Temp 97.6 F (36.4 C) (Oral)   Ht 5' 5"  (1.651 m)   Wt 275 lb 9.6 oz (125 kg)   BMI 45.86 kg/m   BP Readings from Last 3 Encounters:  06/08/18 137/82  01/09/15 179/89  11/24/14 150/73    Wt Readings from Last 3 Encounters:  06/08/18 275 lb 9.6 oz (125 kg)  01/08/15 260 lb (117.9 kg)  11/24/14 260 lb (117.9 kg)     Physical Exam  Constitutional: She is oriented to person, place, and time. She appears well-developed and well-nourished. No distress.  HENT:  Head: Normocephalic and atraumatic.  Right Ear: External ear normal.  Left Ear: External ear normal.  Nose: Nose normal.  Mouth/Throat: Oropharynx is clear and moist.  Eyes: Pupils are equal, round, and reactive to light. Conjunctivae and EOM are normal.  Neck: Normal range of motion. Neck supple. No thyromegaly present.  Cardiovascular: Normal rate, regular rhythm and normal heart sounds.  No murmur heard. Pulmonary/Chest: Effort normal and breath sounds normal. No respiratory distress. She has  no wheezes. She has no rales.  Abdominal: Soft. Bowel sounds are normal. She exhibits no distension. There is no tenderness.  Lymphadenopathy:    She has no cervical adenopathy.  Neurological: She is alert and oriented to person, place, and time. She has normal reflexes.  Skin: Skin is warm and dry.  Psychiatric: She has a normal mood and affect. Her behavior is normal. Judgment and thought content normal.      Assessment & Plan:   Donna Chase was seen today for new patient (initial visit).  Diagnoses and all orders for this visit:  Pain of right hip joint -     Bayer DCA Hb A1c Waived -     CBC with Differential/Platelet -     CMP14+EGFR -     Ambulatory referral to Pain Clinic -     Ambulatory referral to Orthopedic Surgery  HTN (hypertension), benign -     Bayer DCA Hb A1c Waived -     CBC with Differential/Platelet -     CMP14+EGFR  Chronic obstructive pulmonary disease, unspecified COPD type (HCC) -     Bayer DCA Hb A1c Waived -     CBC with Differential/Platelet -     CMP14+EGFR  Type 2 diabetes mellitus with hyperglycemia, unspecified whether long term insulin use (HCC) -     Bayer DCA Hb A1c Waived -     CBC with Differential/Platelet -     CMP14+EGFR  Arthralgia of right hip  Type 2 diabetes mellitus with diabetic neuropathy, without long-term current use of insulin (HCC)  BMI 45.0-49.9, adult ([place])  Other orders -     sitaGLIPtin (JANUVIA) 100 MG tablet; Take 1 tablet (100 mg total) by mouth daily. -     lisinopril (PRINIVIL,ZESTRIL) 40 MG tablet; Take 1 tablet (40 mg total) by mouth daily. -     meloxicam (MOBIC) 15 MG tablet; TK 1 T PO QD WF -     omeprazole (PRILOSEC) 20 MG capsule; TAKE 1 CAPSULE BY MOUTH EVERY DAY -     citalopram (CELEXA) 40 MG tablet; Take 1 tablet (40 mg total) by mouth daily. -     atorvastatin (LIPITOR) 20 MG tablet; Take 1 tablet (20 mg total) by mouth at bedtime.      I have discontinued Donna Chase's glyBURIDE,  predniSONE, diazepam, DSS, diazepam, predniSONE, benzonatate, cetirizine, fluticasone, magic mouthwash, lisinopril, Melatonin, Insulin Glargine, HYDROcodone-acetaminophen, glimepiride, dicyclomine, and albuterol. I have also changed her lisinopril, citalopram, and atorvastatin. Additionally, I am having her start on sitaGLIPtin. Lastly, I am having her maintain her Oxycodone HCl, divalproex, naloxone, ACCU-CHEK SOFTCLIX LANCETS, glucose blood, ONE TOUCH ULTRA 2, meloxicam, and omeprazole.  Meds ordered this encounter  Medications  . sitaGLIPtin (JANUVIA) 100 MG tablet    Sig: Take 1 tablet (100 mg total) by mouth daily.    Dispense:  90 tablet    Refill:  0  . lisinopril (PRINIVIL,ZESTRIL) 40 MG tablet    Sig: Take 1 tablet (40 mg total) by mouth daily.    Dispense:  90 tablet    Refill:  0  . meloxicam (MOBIC) 15 MG tablet  Sig: TK 1 T PO QD WF    Dispense:  90 tablet    Refill:  0  . omeprazole (PRILOSEC) 20 MG capsule    Sig: TAKE 1 CAPSULE BY MOUTH EVERY DAY    Dispense:  90 capsule    Refill:  0  . citalopram (CELEXA) 40 MG tablet    Sig: Take 1 tablet (40 mg total) by mouth daily.    Dispense:  90 tablet    Refill:  0  . atorvastatin (LIPITOR) 20 MG tablet    Sig: Take 1 tablet (20 mg total) by mouth at bedtime.    Dispense:  90 tablet    Refill:  0     Follow-up: No follow-ups on file.  Claretta Fraise, M.D.

## 2018-06-08 NOTE — Patient Instructions (Signed)

## 2018-06-09 LAB — CMP14+EGFR
ALK PHOS: 84 IU/L (ref 39–117)
ALT: 17 IU/L (ref 0–32)
AST: 12 IU/L (ref 0–40)
Albumin/Globulin Ratio: 1.7 (ref 1.2–2.2)
Albumin: 3.9 g/dL (ref 3.6–4.8)
BUN / CREAT RATIO: 22 (ref 12–28)
BUN: 15 mg/dL (ref 8–27)
Bilirubin Total: <0.2 mg/dL (ref 0.0–1.2)
CHLORIDE: 104 mmol/L (ref 96–106)
CO2: 27 mmol/L (ref 20–29)
Calcium: 8.9 mg/dL (ref 8.7–10.3)
Creatinine, Ser: 0.68 mg/dL (ref 0.57–1.00)
GFR calc non Af Amer: 95 mL/min/{1.73_m2} (ref >59)
GFR, EST AFRICAN AMERICAN: 109 mL/min/{1.73_m2} (ref >59)
GLUCOSE: 135 mg/dL — AB (ref 65–99)
Globulin, Total: 2.3 g/dL (ref 1.5–4.5)
Potassium: 4.3 mmol/L (ref 3.5–5.2)
Sodium: 144 mmol/L (ref 134–144)
Total Protein: 6.2 g/dL (ref 6.0–8.5)

## 2018-06-09 LAB — CBC WITH DIFFERENTIAL/PLATELET
BASOS ABS: 0 10*3/uL (ref 0.0–0.2)
Basos: 1 %
EOS (ABSOLUTE): 0.1 10*3/uL (ref 0.0–0.4)
Eos: 2 %
Hematocrit: 36.3 % (ref 34.0–46.6)
Hemoglobin: 11.7 g/dL (ref 11.1–15.9)
Immature Grans (Abs): 0 10*3/uL (ref 0.0–0.1)
Immature Granulocytes: 1 %
LYMPHS ABS: 2.5 10*3/uL (ref 0.7–3.1)
Lymphs: 37 %
MCH: 28.5 pg (ref 26.6–33.0)
MCHC: 32.2 g/dL (ref 31.5–35.7)
MCV: 89 fL (ref 79–97)
MONOS ABS: 0.5 10*3/uL (ref 0.1–0.9)
Monocytes: 7 %
NEUTROS ABS: 3.6 10*3/uL (ref 1.4–7.0)
Neutrophils: 52 %
PLATELETS: 206 10*3/uL (ref 150–450)
RBC: 4.1 x10E6/uL (ref 3.77–5.28)
RDW: 12.2 % — ABNORMAL LOW (ref 12.3–15.4)
WBC: 6.8 10*3/uL (ref 3.4–10.8)

## 2018-06-11 ENCOUNTER — Ambulatory Visit (INDEPENDENT_AMBULATORY_CARE_PROVIDER_SITE_OTHER): Payer: Medicare PPO

## 2018-06-11 ENCOUNTER — Encounter (INDEPENDENT_AMBULATORY_CARE_PROVIDER_SITE_OTHER): Payer: Self-pay | Admitting: Orthopaedic Surgery

## 2018-06-11 ENCOUNTER — Ambulatory Visit (INDEPENDENT_AMBULATORY_CARE_PROVIDER_SITE_OTHER): Payer: Medicare PPO | Admitting: Orthopaedic Surgery

## 2018-06-11 VITALS — BP 161/89 | HR 68 | Ht 65.0 in | Wt 275.0 lb

## 2018-06-11 DIAGNOSIS — G8928 Other chronic postprocedural pain: Secondary | ICD-10-CM

## 2018-06-11 DIAGNOSIS — M25559 Pain in unspecified hip: Principal | ICD-10-CM

## 2018-06-11 DIAGNOSIS — Z96643 Presence of artificial hip joint, bilateral: Secondary | ICD-10-CM

## 2018-06-11 DIAGNOSIS — M25552 Pain in left hip: Secondary | ICD-10-CM

## 2018-06-11 DIAGNOSIS — M25551 Pain in right hip: Secondary | ICD-10-CM

## 2018-06-12 NOTE — Progress Notes (Signed)
Office Visit Note   Patient: Donna Chase           Date of Birth: [birth year]           MRN: [id number] Visit Date: 06/11/2018              Requested by: Mechele Claude, MD [address] [place], [place] [address] PCP: Mechele Claude, MD   Assessment & Plan: Visit Diagnoses:  1. 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 Morbid (severe) obesity due to excess calories (HCC)   3.     Chronic narcotic prescription usage.  Plan: Patient has problems with chronic narcotic prescription use.  She has severe weakness in her hips with weak hip flexion weight quads.  She needs to work on weight loss get into a pool where she can exercise which should not bother her back or her hips.  Patient lives in [place] and was going to a pain clinic in [place].  This apparently has been shut down.  I discussed her she needs to work on weight loss wean down on her narcotics.  No evidence of loosening in her hip joints.  She has severe weakness and needs to work on strengthening exercises to improve her walking.  [place] website  440 narcotics and 221 sedatives reviewed with patient.  Follow-Up Instructions: Return if symptoms worsen or fail to improve.   Orders:  Orders Placed This Encounter  Procedures  . XR HIPS BILAT W OR W/O PELVIS 3-4 VIEWS   No orders of the defined types were placed in this encounter.     Procedures: No procedures performed   Clinical Data: No additional findings.   Subjective: Chief Complaint  Patient presents with  . Right Hip - Pain  . Left Hip - Pain    HPI 62 year old female does not work is seen with bilateral hip pain.  She had right hip surgery x2 in [place] with surgery 02/04/2017 and revision 03/08/2017.  This was revised with cables on the femur.  She also had a left total hip arthroplasty 10 years ago done in [place] and has cables wrapped around the femur on that side as well.  She is been on chronic pain  medication currently on oxycodone 20 mg 4 times a day.  She had previous lumbar surgery by Dr. Lovell Sheehan and states she has persistent back pain.  She ambulates with a walker and walks on her right toe with ambulation to clear left leg.  Significant limitation of left hip range of motion.  She states she is out of her pain medication and is requesting more pain medication.  She states she is waiting to get back into pain management.  Currently on the equivalent of 16 Percocets per day without the Tylenol.  She does have diabetes, panic attacks, osteoporosis.  Morbid obesity BMI 45.  Patient walks with a rolling walker.  Review of Systems positive for hip surgeries I have no records or previous x-rays available.  Surgeries were done in [place].  Positive for bipolar disorder, panic attacks, sleep apnea, asthma, morbid obesity, post total hip arthroplasties, significant lower extremity weakness and gait disturbance.  Also hypertension COPD.   Objective: Vital Signs: BP (!) 161/89   Pulse 68   Ht 5\' 5"  (1.651 m)   Wt 275 lb (124.7 kg)   BMI 45.76 kg/m   Physical Exam  Constitutional: She is oriented to person, place, and time. She appears well-developed.  HENT:  Head: Normocephalic.  Right Ear: External ear normal.  Left Ear: External ear normal.  Eyes: Pupils are equal, round, and reactive to light.  Neck: No tracheal deviation present. No thyromegaly present.  Cardiovascular: Normal rate.  Pulmonary/Chest: Effort normal.  Abdominal: Soft.  Neurological: She is alert and oriented to person, place, and time.  Skin: Skin is warm and dry.  Psychiatric: She has a normal mood and affect. Her behavior is normal.    Ortho Exam patient has 80 degrees hip flexion on the left with 10 degrees internal/external rotation stiff hip and significant hip flexion weakness on the left.  Bilateral quad weakness.  Right hip flexion is rated mild to moderate weakness left is severe.  Both quads are weak.   Anterior tib gastrocsoleus is active.  She ambulates with a rolling walker rises up on her toe on her right foot when she walks to clear her left foot.  Both knees flex past 120 degrees without knee effusion.  Specialty Comments:  No specialty comments available.  Imaging: Xr Hips Bilat W Or W/o Pelvis 3-4 Views  Result Date: 06/12/2018 AP pelvis and frog-leg x-ray right and left hip obtained and reviewed.  This shows bilateral total hip arthroplasties.  Single screw in the acetabulum on the right.  No acetabular loosening.  Both femurs show significant heterotopic bone with cables x3 on the right x2 on the left without subsidence.  S/ROM prosthesis on the left femur.  Right femur shows changes consistent with femur fracture with revision and cables.  No evidence of femoral stem loosening. 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  Likely right interop femur fracture with revision which is healed.    PMFS History: Patient Active Problem List   Diagnosis Date Noted  . BMI 45.0-49.9, adult (HCC) 05/28/2018  . Arthralgia of right hip 05/18/2018  . Primary osteoarthritis of both hips 12/04/2017  . Irritable bowel syndrome 07/30/2017  . Hyperlipidemia 05/30/2017  . HTN (hypertension), benign 05/30/2017  . History of bilateral hip replacements 05/30/2017  . History of back surgery 05/30/2017  . Myofascial pain syndrome 04/22/2017  . Disuse muscle atrophy 04/22/2017  . COPD (chronic obstructive pulmonary disease) (HCC) 06/10/2016  . Carpal tunnel syndrome of left wrist 01/25/2016  . Tobacco use disorder 06/07/2015  . Morbid (severe) obesity due to excess calories (HCC) 12/13/2014  . Postlaminectomy syndrome, not elsewhere classified 12/13/2014  . Osteoporosis 01/17/2014  . Diabetes (HCC) 11/24/2009  . Type 2 diabetes mellitus with hyperglycemia (HCC) 11/24/2009  . Vitamin B12 deficiency (non anemic) 08/15/2009  . Panic  attack 07/06/2009  . Asthma 12/10/2007  . Sleep apnea 12/10/2007  . Bipolar affective disorder (HCC) 12/10/2007  . Vitamin B deficiency 12/10/2007   Past Medical History:  Diagnosis Date  . Arthritis   . Bipolar 1 disorder (HCC)   . COPD (chronic obstructive pulmonary disease) (HCC)   . Diabetes mellitus without complication (HCC)    fasting 80-90  . Diabetes mellitus without complication (HCC)   . History of panic attacks    takes depakote for this  . Hypertension   . Sleep apnea     No family history on file.  Past Surgical History:  Procedure Laterality Date  . JOINT REPLACEMENT Left    hip    Social History   Occupational History  . Not on file  Tobacco Use  . Smoking status: Current Every Day Smoker    Packs/day: 0.50  Types: Cigarettes  . Smokeless tobacco: Never Used  Substance and Sexual Activity  . Alcohol use: No  . Drug use: No  . Sexual activity: Not Currently

## 2018-09-08 ENCOUNTER — Ambulatory Visit: Payer: Medicare PPO | Admitting: Family Medicine

## 2018-09-09 ENCOUNTER — Encounter: Payer: Self-pay | Admitting: Family Medicine

## 2018-12-31 ENCOUNTER — Telehealth: Payer: Self-pay | Admitting: Family Medicine
